# Patient Record
Sex: Female | Born: 1940 | Race: White | Hispanic: No | Smoking: Former smoker
Health system: Southern US, Community
[De-identification: ages and names within clinical notes are randomized; demographics above are authoritative.]

## PROBLEM LIST (undated history)

## (undated) DIAGNOSIS — K219 Gastro-esophageal reflux disease without esophagitis: Secondary | ICD-10-CM

## (undated) DIAGNOSIS — R51 Headache: Secondary | ICD-10-CM

## (undated) DIAGNOSIS — E039 Hypothyroidism, unspecified: Secondary | ICD-10-CM

## (undated) DIAGNOSIS — Z8489 Family history of other specified conditions: Secondary | ICD-10-CM

## (undated) DIAGNOSIS — D649 Anemia, unspecified: Secondary | ICD-10-CM

## (undated) DIAGNOSIS — Z87442 Personal history of urinary calculi: Secondary | ICD-10-CM

## (undated) DIAGNOSIS — C801 Malignant (primary) neoplasm, unspecified: Secondary | ICD-10-CM

## (undated) DIAGNOSIS — I10 Essential (primary) hypertension: Secondary | ICD-10-CM

## (undated) DIAGNOSIS — R519 Headache, unspecified: Secondary | ICD-10-CM

## (undated) DIAGNOSIS — E119 Type 2 diabetes mellitus without complications: Secondary | ICD-10-CM

## (undated) DIAGNOSIS — M1990 Unspecified osteoarthritis, unspecified site: Secondary | ICD-10-CM

## (undated) DIAGNOSIS — Z973 Presence of spectacles and contact lenses: Secondary | ICD-10-CM

## (undated) DIAGNOSIS — M7130 Other bursal cyst, unspecified site: Secondary | ICD-10-CM

## (undated) HISTORY — PX: COLONOSCOPY WITH ESOPHAGOGASTRODUODENOSCOPY (EGD): SHX5779

## (undated) HISTORY — PX: APPENDECTOMY: SHX54

## (undated) HISTORY — DX: Essential (primary) hypertension: I10

## (undated) HISTORY — PX: EYE SURGERY: SHX253

## (undated) HISTORY — PX: CHOLECYSTECTOMY: SHX55

## (undated) HISTORY — PX: BREAST SURGERY: SHX581

## (undated) HISTORY — DX: Type 2 diabetes mellitus without complications: E11.9

## (undated) HISTORY — PX: CYSTOSCOPY W/ STONE MANIPULATION: SHX1427

## (undated) HISTORY — DX: Malignant (primary) neoplasm, unspecified: C80.1

## (undated) HISTORY — PX: CATARACT EXTRACTION W/ INTRAOCULAR LENS  IMPLANT, BILATERAL: SHX1307

---

## 1982-07-11 HISTORY — PX: CHOLECYSTECTOMY: SHX55

## 1992-07-11 HISTORY — PX: BREAST SURGERY: SHX581

## 1999-02-16 ENCOUNTER — Ambulatory Visit (HOSPITAL_COMMUNITY): Admission: RE | Admit: 1999-02-16 | Discharge: 1999-02-16 | Payer: Self-pay | Admitting: *Deleted

## 2000-02-11 ENCOUNTER — Other Ambulatory Visit: Admission: RE | Admit: 2000-02-11 | Discharge: 2000-02-11 | Payer: Self-pay | Admitting: Obstetrics and Gynecology

## 2001-03-16 ENCOUNTER — Other Ambulatory Visit: Admission: RE | Admit: 2001-03-16 | Discharge: 2001-03-16 | Payer: Self-pay | Admitting: Obstetrics and Gynecology

## 2002-05-21 ENCOUNTER — Encounter: Payer: Self-pay | Admitting: Internal Medicine

## 2002-05-21 ENCOUNTER — Encounter: Admission: RE | Admit: 2002-05-21 | Discharge: 2002-05-21 | Payer: Self-pay | Admitting: Internal Medicine

## 2003-02-13 ENCOUNTER — Encounter: Admission: RE | Admit: 2003-02-13 | Discharge: 2003-02-13 | Payer: Self-pay | Admitting: Internal Medicine

## 2003-02-13 ENCOUNTER — Encounter: Payer: Self-pay | Admitting: Internal Medicine

## 2003-11-19 ENCOUNTER — Ambulatory Visit (HOSPITAL_COMMUNITY): Admission: RE | Admit: 2003-11-19 | Discharge: 2003-11-19 | Payer: Self-pay | Admitting: Internal Medicine

## 2004-11-08 ENCOUNTER — Encounter: Admission: RE | Admit: 2004-11-08 | Discharge: 2004-11-08 | Payer: Self-pay | Admitting: Internal Medicine

## 2004-12-10 ENCOUNTER — Ambulatory Visit (HOSPITAL_COMMUNITY): Admission: RE | Admit: 2004-12-10 | Discharge: 2004-12-10 | Payer: Self-pay | Admitting: *Deleted

## 2005-12-19 ENCOUNTER — Encounter: Admission: RE | Admit: 2005-12-19 | Discharge: 2005-12-19 | Payer: Self-pay | Admitting: Internal Medicine

## 2007-03-28 ENCOUNTER — Encounter: Admission: RE | Admit: 2007-03-28 | Discharge: 2007-03-28 | Payer: Self-pay | Admitting: Internal Medicine

## 2007-04-20 ENCOUNTER — Ambulatory Visit (HOSPITAL_BASED_OUTPATIENT_CLINIC_OR_DEPARTMENT_OTHER): Admission: RE | Admit: 2007-04-20 | Discharge: 2007-04-20 | Payer: Self-pay | Admitting: General Surgery

## 2007-04-20 ENCOUNTER — Encounter (INDEPENDENT_AMBULATORY_CARE_PROVIDER_SITE_OTHER): Payer: Self-pay | Admitting: General Surgery

## 2007-05-04 ENCOUNTER — Ambulatory Visit: Payer: Self-pay | Admitting: Internal Medicine

## 2007-05-10 ENCOUNTER — Ambulatory Visit (HOSPITAL_COMMUNITY): Admission: RE | Admit: 2007-05-10 | Discharge: 2007-05-10 | Payer: Self-pay | Admitting: Internal Medicine

## 2007-05-10 LAB — CBC WITH DIFFERENTIAL/PLATELET
Basophils Absolute: 0 10*3/uL (ref 0.0–0.1)
Eosinophils Absolute: 0.2 10*3/uL (ref 0.0–0.5)
HCT: 39 % (ref 34.8–46.6)
HGB: 13.2 g/dL (ref 11.6–15.9)
MCV: 83.2 fL (ref 81.0–101.0)
MONO#: 0.5 10*3/uL (ref 0.1–0.9)
Platelets: 245 10*3/uL (ref 145–400)
RDW: 13.6 % (ref 11.3–14.5)
lymph#: 1.7 10*3/uL (ref 0.9–3.3)

## 2007-05-10 LAB — COMPREHENSIVE METABOLIC PANEL
Albumin: 4.4 g/dL (ref 3.5–5.2)
Alkaline Phosphatase: 90 U/L (ref 39–117)
BUN: 25 mg/dL — ABNORMAL HIGH (ref 6–23)
CO2: 26 mEq/L (ref 19–32)
Glucose, Bld: 138 mg/dL — ABNORMAL HIGH (ref 70–99)

## 2007-05-10 LAB — LACTATE DEHYDROGENASE: LDH: 159 U/L (ref 94–250)

## 2007-11-06 ENCOUNTER — Ambulatory Visit: Payer: Self-pay | Admitting: Internal Medicine

## 2007-11-08 LAB — CBC WITH DIFFERENTIAL/PLATELET
EOS%: 1.8 % (ref 0.0–7.0)
HGB: 13.2 g/dL (ref 11.6–15.9)
LYMPH%: 29.2 % (ref 14.0–48.0)
MCH: 28 pg (ref 26.0–34.0)
MONO#: 0.7 10*3/uL (ref 0.1–0.9)
NEUT#: 4.6 10*3/uL (ref 1.5–6.5)
NEUT%: 59.8 % (ref 39.6–76.8)
Platelets: 261 10*3/uL (ref 145–400)
RBC: 4.7 10*6/uL (ref 3.70–5.32)
RDW: 13.5 % (ref 11.3–14.5)

## 2007-11-08 LAB — COMPREHENSIVE METABOLIC PANEL
ALT: 22 U/L (ref 0–35)
AST: 16 U/L (ref 0–37)
Alkaline Phosphatase: 75 U/L (ref 39–117)
Glucose, Bld: 108 mg/dL — ABNORMAL HIGH (ref 70–99)
Potassium: 4.3 mEq/L (ref 3.5–5.3)
Sodium: 141 mEq/L (ref 135–145)
Total Bilirubin: 0.5 mg/dL (ref 0.3–1.2)
Total Protein: 6.6 g/dL (ref 6.0–8.3)

## 2007-11-08 LAB — LACTATE DEHYDROGENASE: LDH: 150 U/L (ref 94–250)

## 2008-05-05 ENCOUNTER — Ambulatory Visit (HOSPITAL_COMMUNITY): Admission: RE | Admit: 2008-05-05 | Discharge: 2008-05-05 | Payer: Self-pay | Admitting: Internal Medicine

## 2008-05-06 ENCOUNTER — Ambulatory Visit: Payer: Self-pay | Admitting: Internal Medicine

## 2008-05-08 LAB — CBC WITH DIFFERENTIAL/PLATELET
EOS%: 4.1 % (ref 0.0–7.0)
HGB: 13.7 g/dL (ref 11.6–15.9)
MCH: 28.1 pg (ref 26.0–34.0)
MCHC: 33.6 g/dL (ref 32.0–36.0)
MCV: 83.7 fL (ref 81.0–101.0)
MONO%: 11.1 % (ref 0.0–13.0)
NEUT%: 57.4 % (ref 39.6–76.8)
Platelets: 203 10*3/uL (ref 145–400)
RBC: 4.87 10*6/uL (ref 3.70–5.32)
RDW: 13.7 % (ref 11.3–14.5)
WBC: 6 10*3/uL (ref 3.9–10.0)
lymph#: 1.6 10*3/uL (ref 0.9–3.3)

## 2008-05-08 LAB — COMPREHENSIVE METABOLIC PANEL
BUN: 23 mg/dL (ref 6–23)
Glucose, Bld: 155 mg/dL — ABNORMAL HIGH (ref 70–99)
Sodium: 140 mEq/L (ref 135–145)

## 2008-05-08 LAB — LACTATE DEHYDROGENASE: LDH: 153 U/L (ref 94–250)

## 2008-11-03 ENCOUNTER — Ambulatory Visit: Payer: Self-pay | Admitting: Internal Medicine

## 2008-11-05 LAB — CBC WITH DIFFERENTIAL/PLATELET
BASO%: 0.3 % (ref 0.0–2.0)
Basophils Absolute: 0 10*3/uL (ref 0.0–0.1)
Eosinophils Absolute: 0.2 10*3/uL (ref 0.0–0.5)
HGB: 13.6 g/dL (ref 11.6–15.9)
LYMPH%: 28.4 % (ref 14.0–49.7)
MCH: 28.1 pg (ref 25.1–34.0)
MCHC: 33.2 g/dL (ref 31.5–36.0)
MCV: 84.7 fL (ref 79.5–101.0)
NEUT#: 4.3 10*3/uL (ref 1.5–6.5)
RBC: 4.83 10*6/uL (ref 3.70–5.45)
WBC: 7.5 10*3/uL (ref 3.9–10.3)
lymph#: 2.1 10*3/uL (ref 0.9–3.3)

## 2008-11-05 LAB — COMPREHENSIVE METABOLIC PANEL
ALT: 37 U/L — ABNORMAL HIGH (ref 0–35)
Albumin: 4.2 g/dL (ref 3.5–5.2)
Alkaline Phosphatase: 87 U/L (ref 39–117)
CO2: 28 mEq/L (ref 19–32)
Glucose, Bld: 105 mg/dL — ABNORMAL HIGH (ref 70–99)
Total Bilirubin: 0.6 mg/dL (ref 0.3–1.2)
Total Protein: 6.5 g/dL (ref 6.0–8.3)

## 2008-11-05 LAB — LACTATE DEHYDROGENASE: LDH: 155 U/L (ref 94–250)

## 2009-04-30 ENCOUNTER — Ambulatory Visit: Payer: Self-pay | Admitting: Internal Medicine

## 2009-05-04 ENCOUNTER — Ambulatory Visit (HOSPITAL_COMMUNITY): Admission: RE | Admit: 2009-05-04 | Discharge: 2009-05-04 | Payer: Self-pay | Admitting: Internal Medicine

## 2009-05-04 LAB — CBC WITH DIFFERENTIAL/PLATELET
EOS%: 2.6 % (ref 0.0–7.0)
HCT: 41.8 % (ref 34.8–46.6)
HGB: 13.9 g/dL (ref 11.6–15.9)
LYMPH%: 25.2 % (ref 14.0–49.7)
MCHC: 33.2 g/dL (ref 31.5–36.0)
MCV: 85 fL (ref 79.5–101.0)
MONO#: 0.8 10*3/uL (ref 0.1–0.9)
MONO%: 9.3 % (ref 0.0–14.0)
NEUT#: 5.2 10*3/uL (ref 1.5–6.5)
RDW: 13.7 % (ref 11.2–14.5)

## 2009-05-04 LAB — COMPREHENSIVE METABOLIC PANEL
AST: 17 U/L (ref 0–37)
Albumin: 4.6 g/dL (ref 3.5–5.2)
Alkaline Phosphatase: 93 U/L (ref 39–117)
BUN: 24 mg/dL — ABNORMAL HIGH (ref 6–23)
CO2: 25 mEq/L (ref 19–32)
Chloride: 107 mEq/L (ref 96–112)
Potassium: 4.3 mEq/L (ref 3.5–5.3)
Total Bilirubin: 0.5 mg/dL (ref 0.3–1.2)

## 2009-05-04 LAB — LACTATE DEHYDROGENASE: LDH: 169 U/L (ref 94–250)

## 2009-11-03 IMAGING — CR DG CHEST 2V
2 series · 2 of 2 positions shown · non-contrast
Comparison: 05/10/2007

CLINICAL DATA: Melanoma

CHEST - 2 VIEW

[w chest pa]
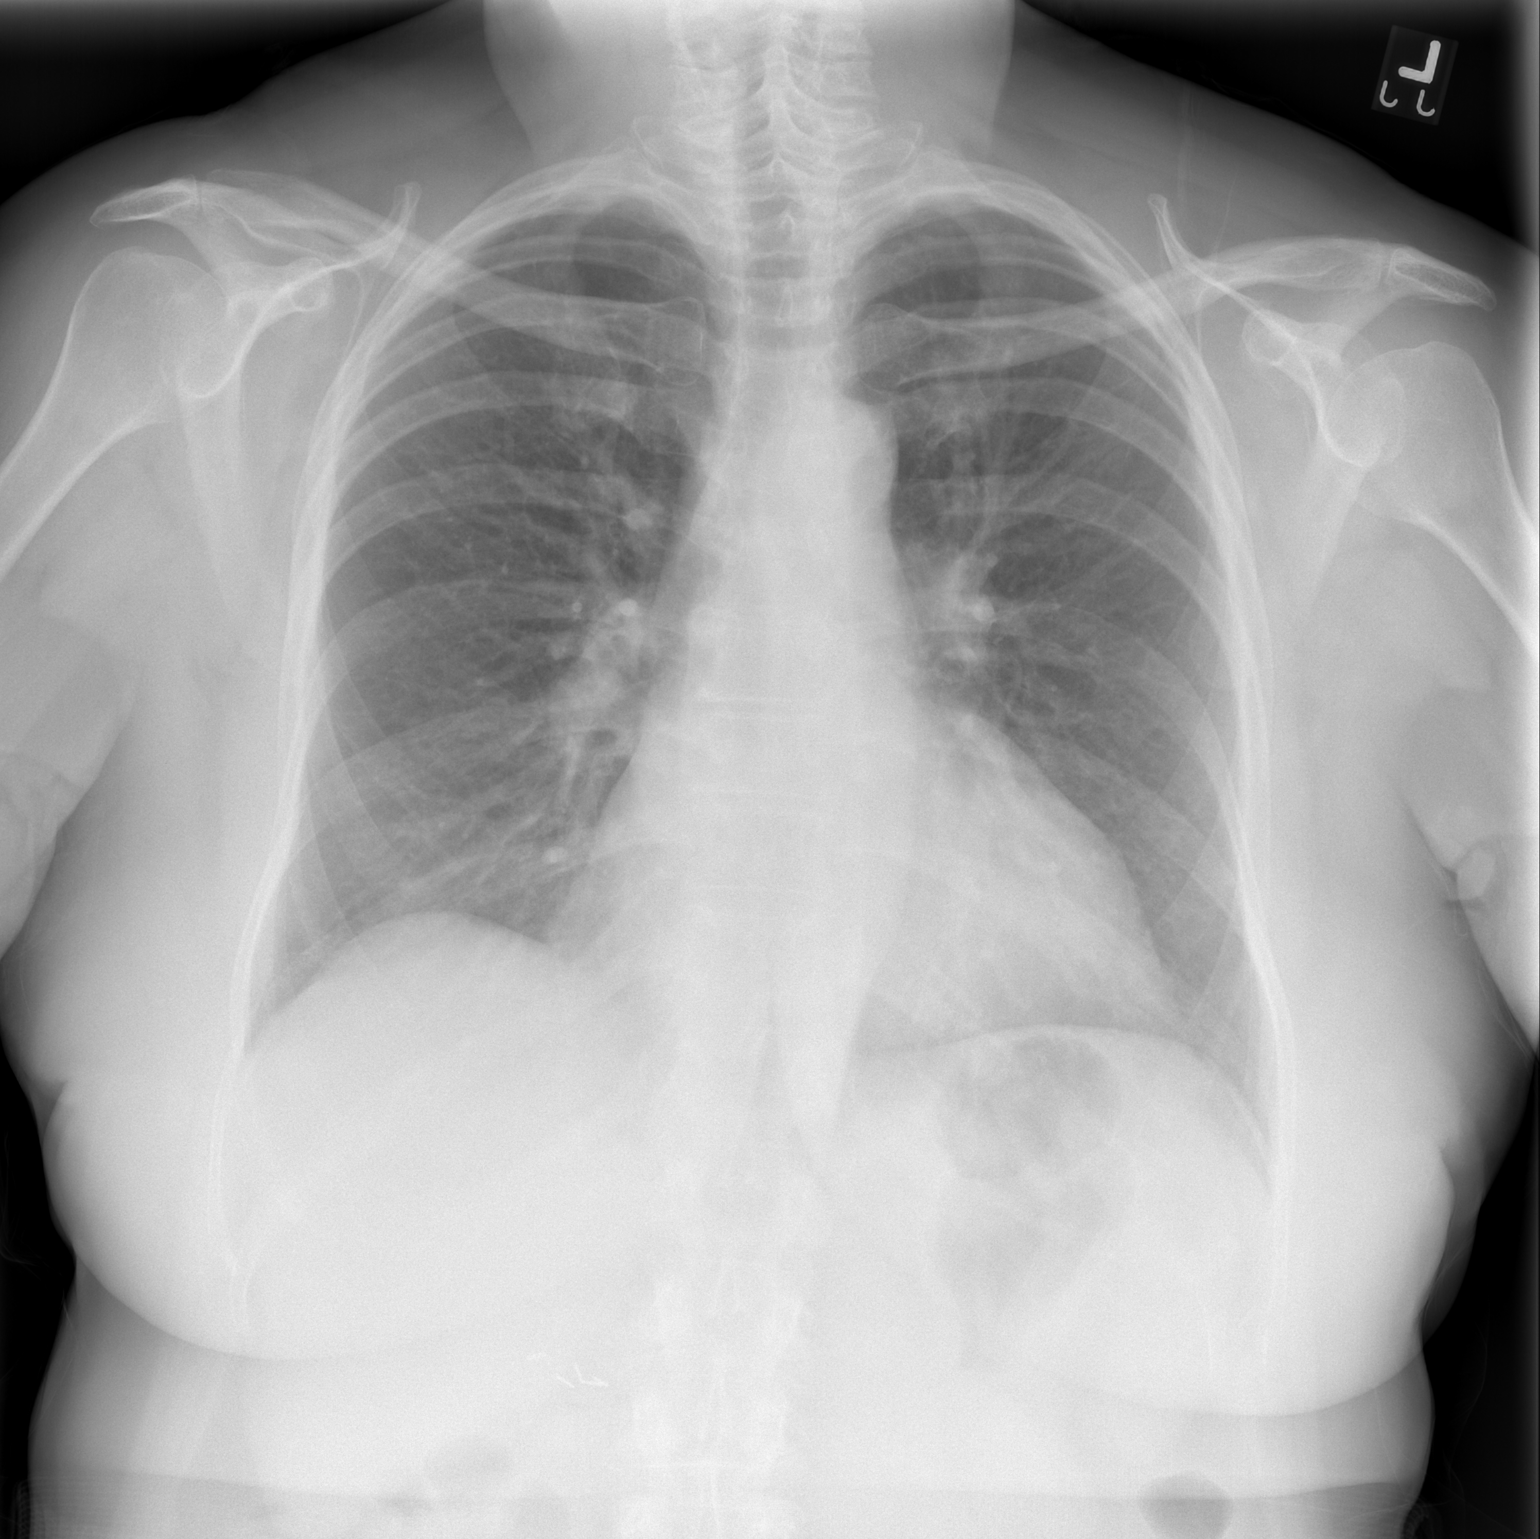

[w chest lat]
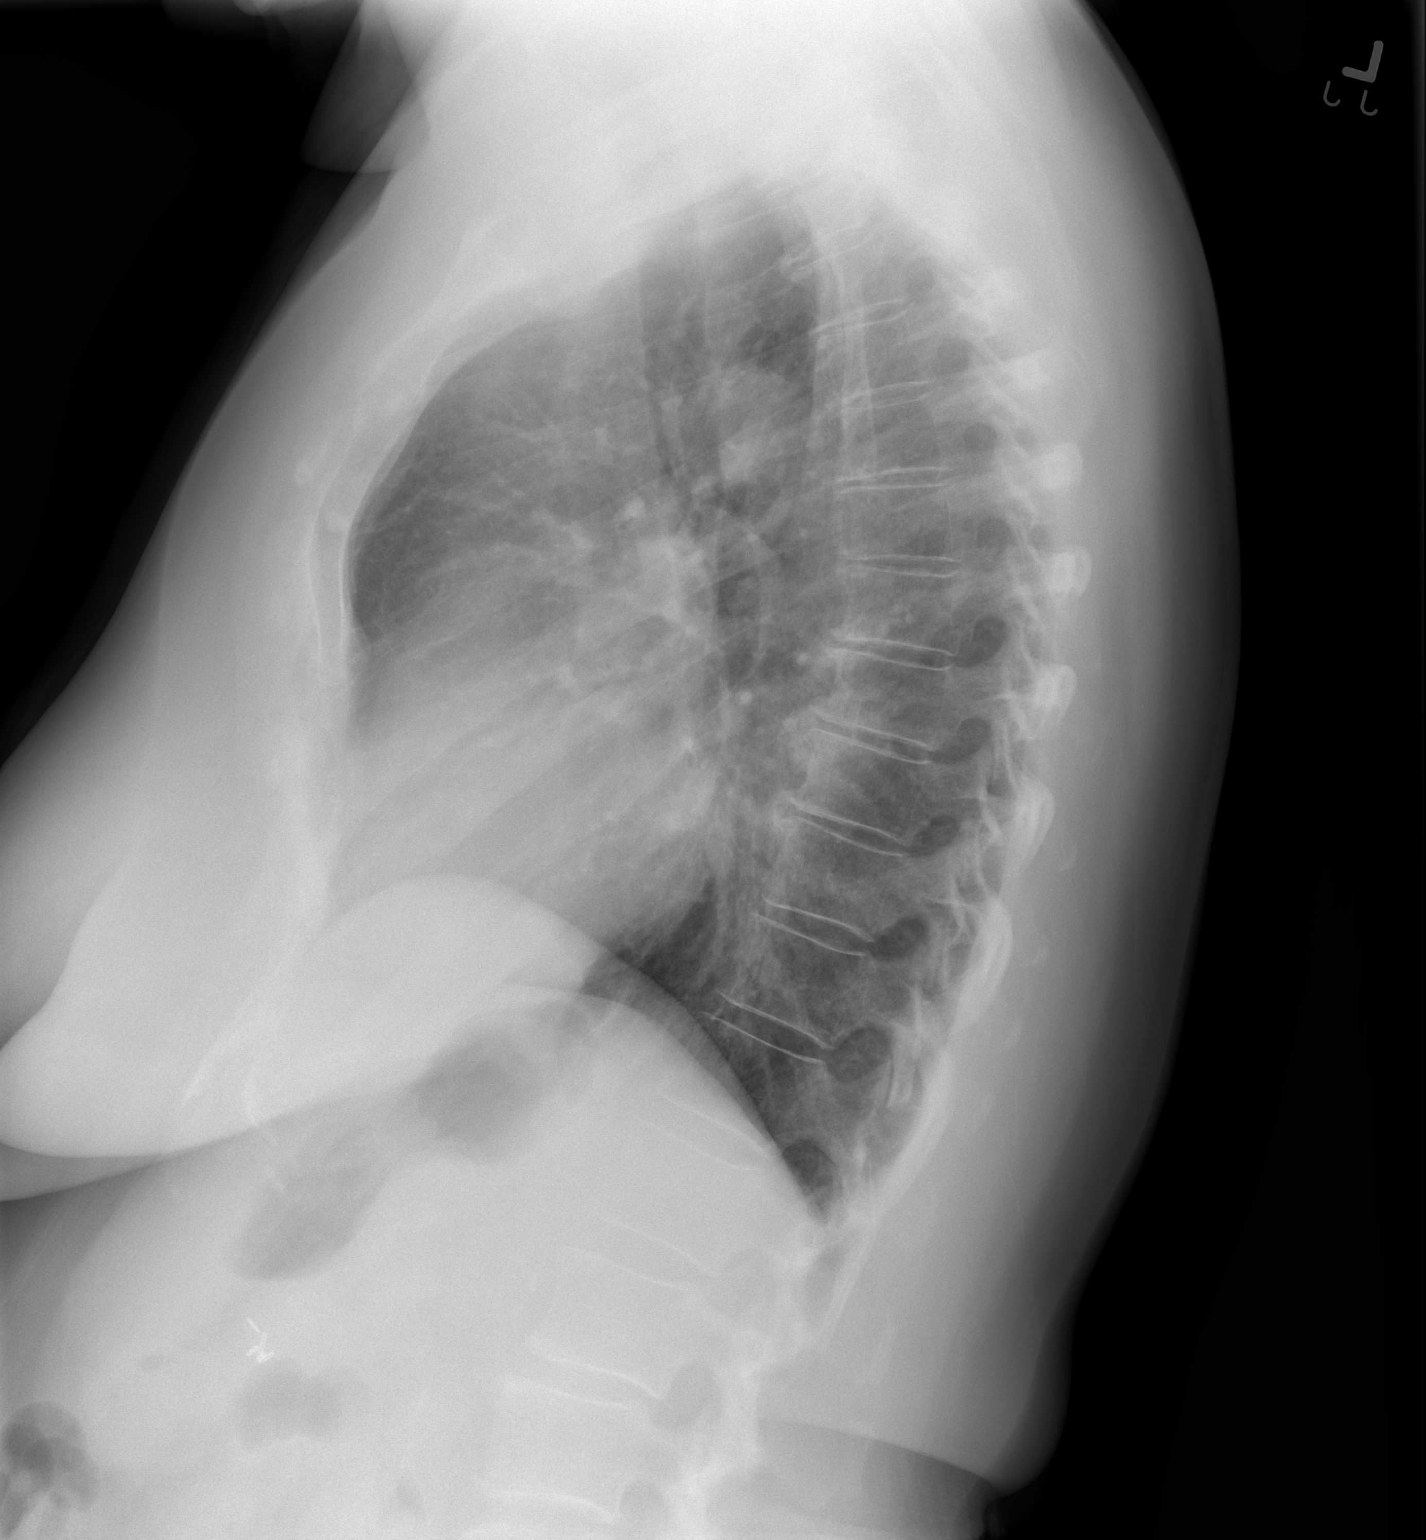

[2 of 2 positions shown; findings below may reference images not displayed]

FINDINGS: Heart mildly enlarged without change.  No congestive
heart failure, metastatic or active disease.  No osseous lesions
IMPRESSION: Stable cardiomegaly - no active disease.

## 2010-04-27 ENCOUNTER — Ambulatory Visit: Payer: Self-pay | Admitting: Internal Medicine

## 2010-04-29 ENCOUNTER — Ambulatory Visit (HOSPITAL_COMMUNITY): Admission: RE | Admit: 2010-04-29 | Discharge: 2010-04-29 | Payer: Self-pay | Admitting: Internal Medicine

## 2010-04-29 LAB — COMPREHENSIVE METABOLIC PANEL
ALT: 19 U/L (ref 0–35)
Alkaline Phosphatase: 92 U/L (ref 39–117)
CO2: 26 mEq/L (ref 19–32)
Creatinine, Ser: 0.97 mg/dL (ref 0.40–1.20)
Potassium: 4.4 mEq/L (ref 3.5–5.3)
Sodium: 140 mEq/L (ref 135–145)
Total Bilirubin: 0.4 mg/dL (ref 0.3–1.2)

## 2010-04-29 LAB — CBC WITH DIFFERENTIAL/PLATELET
Basophils Absolute: 0 10*3/uL (ref 0.0–0.1)
Eosinophils Absolute: 0.1 10*3/uL (ref 0.0–0.5)
HGB: 13.7 g/dL (ref 11.6–15.9)
LYMPH%: 23.8 % (ref 14.0–49.7)
MCV: 84.2 fL (ref 79.5–101.0)
MONO#: 0.7 10*3/uL (ref 0.1–0.9)
NEUT#: 4.2 10*3/uL (ref 1.5–6.5)
RBC: 4.78 10*6/uL (ref 3.70–5.45)
WBC: 6.7 10*3/uL (ref 3.9–10.3)

## 2010-11-23 NOTE — Op Note (Signed)
NAME:  Linda Frederick, Linda Frederick                  ACCOUNT NO.:  [id number]   MEDICAL RECORD NO.:  [id number]          PATIENT TYPE:  AMB   LOCATION:  DSC                          FACILITY:  [facility]   PHYSICIAN:  Gabrielle Dare. Janee Morn, M.D.DATE OF BIRTH:  09-10-1940   DATE OF PROCEDURE:  04/20/2007  DATE OF DISCHARGE:                               OPERATIVE REPORT   PREOPERATIVE DIAGNOSIS:  Melanoma, left arm.   POSTOPERATIVE DIAGNOSIS:  Melanoma, left arm.   PROCEDURE:  1. Left axillary sentinel lymph node biopsy with lymph node mapping      and blue dye injection.  2. Wide excision, melanoma, left arm, 10 x 4.5 cm.   SURGEON:  Violeta Gelinas, M.D.   HISTORY OF PRESENT ILLNESS:  Linda Frederick is a 70 year old female who I  evaluated in the office for a melanoma in her left arm.  She had a  biopsy done by Dr. Yetta Barre showing a superficial spreading melanoma about  1.75 mm in thickness.  Margins, however, were positive.  There was no  ulceration.  She presents today for left axillary sentinel lymph node  biopsy and wide excision of this melanoma.   PROCEDURE IN DETAIL:  Informed consent was obtained.  The patient was  identified in the preoperative holding area.  She had been injected  around her biopsy site by nuclear medicine.  She noted some recent  poison ivy rash, but none of this was near the planned operative sites.  She received intravenous antibiotics.  She was brought to the operating  room.   PROCEDURE IN DETAIL:  General anesthesia was administered by the  anesthesia staff by laryngeal mask airway.  Her biopsy site was then  injected with 1 mL of methylene blue in a 1:4 concentration with  injectable saline.  This was a cutaneous injection that was then  massaged for 4 minutes by the clock.  Next, her left upper extremity  anterior chest and left axilla were prepped and draped in a sterile  fashion.  The NeoProbe was used to find a hot location in her axilla.  Quarter percent Marcaine with  epinephrine was injected at this site.  A  transverse incision was made.  Subcutaneous tissues were dissected down  into the axillary fat.  We avoided the thoracodorsal and long thoracic  nerves as well as the axillary vein.  We found a blue lymph node that  was hot with the NeoProbe.  This was circumferentially dissected.  Bovie  was used to get good hemostasis, and this was removed and sent as  sentinel lymph node.  It was a hot blue node.  No further blue nodes  were identified in the axilla, and there was no other high signal  readings on the NeoProbe.  So, this completed the sentinel lymph node  dissection.  The wound was copiously irrigated, and meticulous  hemostasis was assured.  Some additional local anesthetic was injected.  The wound was then closed in layers with deep tissues approximated with  running 3-0 Vicryl suture and the skin closed with a running 4-0  Monocryl subcuticular  stitch.  Sponge, needle, and instrument counts  were correct for that portion of the procedure.  The patient's arm was  then brought over in front of her within the sterile field, and  attention was directed to her triceps area.  This is where her biopsy  site was.  We measured out 1.5-cm margin circumferentially.  An  elliptical incision was then planned to encompass this.  This was 10 x  4.5 cm in size.  Some additional local anesthetic was injected in this  area.  An elliptical incision was made.  Subcutaneous tissues were  dissected down to the underlying fascia, excising this ellipse in one  piece.  Hemostasis was obtained with a Bovie cautery.  The specimen was  marked for orientation for pathology and sent away.  We discarded those  instruments that were used handling it.  The wound was copiously  irrigated.  Meticulous hemostasis was ensured.  Some flaps were raised  subcutaneously on the medial and lateral margins.  Wound was then closed  with deep layers approximated with interrupted 2-0  Vicryl sutures.  Area  was again irrigated.  Meticulous hemostasis was assured, and the skin  was closed with interrupted 3-0 nylon sutures.  Sponge, needle and  instrument counts were correct.  Sterile dressing was applied.  The  patient tolerated the procedure well without apparent complication.  He  was taken to the recovery room in stable condition.      Gabrielle Dare Janee Morn, M.D.  Electronically Signed     BET/MEDQ  D:  04/20/2007  T:  04/21/2007  Job:  [id number]

## 2011-04-21 LAB — BASIC METABOLIC PANEL
Calcium: 9.5
Chloride: 104
Creatinine, Ser: 1.32 — ABNORMAL HIGH
GFR calc Af Amer: 49 — ABNORMAL LOW
GFR calc non Af Amer: 41 — ABNORMAL LOW
Glucose, Bld: 135 — ABNORMAL HIGH
Potassium: 4.4

## 2011-05-02 ENCOUNTER — Ambulatory Visit (HOSPITAL_COMMUNITY)
Admission: RE | Admit: 2011-05-02 | Discharge: 2011-05-02 | Disposition: A | Discharge status: Home / Self Care | Payer: Medicare Other | Source: Ambulatory Visit | Attending: Internal Medicine | Admitting: Internal Medicine

## 2011-05-02 ENCOUNTER — Other Ambulatory Visit: Payer: Self-pay | Admitting: Internal Medicine

## 2011-05-02 DIAGNOSIS — Z9089 Acquired absence of other organs: Secondary | ICD-10-CM | POA: Insufficient documentation

## 2011-05-02 DIAGNOSIS — C439 Malignant melanoma of skin, unspecified: Secondary | ICD-10-CM

## 2011-05-02 DIAGNOSIS — Z85820 Personal history of malignant melanoma of skin: Secondary | ICD-10-CM | POA: Insufficient documentation

## 2011-05-09 ENCOUNTER — Telehealth: Payer: Self-pay | Admitting: Internal Medicine

## 2011-05-09 ENCOUNTER — Other Ambulatory Visit: Payer: Self-pay | Admitting: Internal Medicine

## 2011-05-09 ENCOUNTER — Encounter (HOSPITAL_BASED_OUTPATIENT_CLINIC_OR_DEPARTMENT_OTHER): Payer: Medicare Other | Admitting: Internal Medicine

## 2011-05-09 DIAGNOSIS — C4360 Malignant melanoma of unspecified upper limb, including shoulder: Secondary | ICD-10-CM

## 2011-05-09 LAB — CBC WITH DIFFERENTIAL/PLATELET
BASO%: 0.2 % (ref 0.0–2.0)
Eosinophils Absolute: 0.1 10*3/uL (ref 0.0–0.5)
HCT: 42.7 % (ref 34.8–46.6)
MCH: 28 pg (ref 25.1–34.0)
MCV: 84.8 fL (ref 79.5–101.0)
MONO#: 0.6 10*3/uL (ref 0.1–0.9)
MONO%: 8.9 % (ref 0.0–14.0)
NEUT%: 64.1 % (ref 38.4–76.8)
RBC: 5.03 10*6/uL (ref 3.70–5.45)
RDW: 13.7 % (ref 11.2–14.5)
WBC: 7 10*3/uL (ref 3.9–10.3)

## 2011-05-09 LAB — COMPREHENSIVE METABOLIC PANEL
ALT: 16 U/L (ref 0–35)
Albumin: 4.3 g/dL (ref 3.5–5.2)
CO2: 28 mEq/L (ref 19–32)
Calcium: 10.5 mg/dL (ref 8.4–10.5)
Chloride: 103 mEq/L (ref 96–112)
Creatinine, Ser: 0.94 mg/dL (ref 0.50–1.10)

## 2011-05-09 LAB — LACTATE DEHYDROGENASE: LDH: 149 U/L (ref 94–250)

## 2011-05-09 NOTE — Telephone Encounter (Signed)
gv pt appt schedule for oct 2013 including cxr to be done @ [facility] a few days b4 f/u.

## 2012-05-07 ENCOUNTER — Other Ambulatory Visit: Payer: Self-pay | Admitting: Internal Medicine

## 2012-05-07 ENCOUNTER — Ambulatory Visit (HOSPITAL_COMMUNITY)
Admission: RE | Admit: 2012-05-07 | Discharge: 2012-05-07 | Disposition: A | Discharge status: Home / Self Care | Payer: Medicare Other | Source: Ambulatory Visit | Attending: Internal Medicine | Admitting: Internal Medicine

## 2012-05-07 DIAGNOSIS — Z85820 Personal history of malignant melanoma of skin: Secondary | ICD-10-CM | POA: Insufficient documentation

## 2012-05-07 DIAGNOSIS — I10 Essential (primary) hypertension: Secondary | ICD-10-CM | POA: Insufficient documentation

## 2012-05-08 ENCOUNTER — Other Ambulatory Visit (HOSPITAL_BASED_OUTPATIENT_CLINIC_OR_DEPARTMENT_OTHER): Payer: Medicare Other | Admitting: Lab

## 2012-05-08 ENCOUNTER — Ambulatory Visit (HOSPITAL_BASED_OUTPATIENT_CLINIC_OR_DEPARTMENT_OTHER): Payer: Medicare Other | Admitting: Internal Medicine

## 2012-05-08 VITALS — BP 131/72 | HR 52 | Temp 97.5°F | Resp 20 | Wt 183.0 lb

## 2012-05-08 DIAGNOSIS — C439 Malignant melanoma of skin, unspecified: Secondary | ICD-10-CM

## 2012-05-08 DIAGNOSIS — Z85820 Personal history of malignant melanoma of skin: Secondary | ICD-10-CM

## 2012-05-08 LAB — COMPREHENSIVE METABOLIC PANEL (CC13)
ALT: 18 U/L (ref 0–55)
AST: 13 U/L (ref 5–34)
Albumin: 4.2 g/dL (ref 3.5–5.0)
CO2: 30 mEq/L — ABNORMAL HIGH (ref 22–29)
Calcium: 10.1 mg/dL (ref 8.4–10.4)
Chloride: 106 mEq/L (ref 98–107)
Potassium: 4.1 mEq/L (ref 3.5–5.1)
Sodium: 142 mEq/L (ref 136–145)
Total Protein: 6.8 g/dL (ref 6.4–8.3)

## 2012-05-08 LAB — CBC WITH DIFFERENTIAL/PLATELET
BASO%: 0.2 % (ref 0.0–2.0)
HCT: 41.3 % (ref 34.8–46.6)
MCHC: 33.6 g/dL (ref 31.5–36.0)
MONO#: 0.7 10*3/uL (ref 0.1–0.9)
NEUT%: 66.1 % (ref 38.4–76.8)
RBC: 4.89 10*6/uL (ref 3.70–5.45)
RDW: 13.9 % (ref 11.2–14.5)
WBC: 8 10*3/uL (ref 3.9–10.3)
lymph#: 1.9 10*3/uL (ref 0.9–3.3)

## 2012-05-08 LAB — LACTATE DEHYDROGENASE (CC13): LDH: 160 U/L (ref 125–220)

## 2012-05-08 NOTE — Progress Notes (Signed)
[facility] Telephone:([phone]) [phone]   Fax:([phone]) [phone]  OFFICE PROGRESS NOTE  DIAGNOSIS: Stage IB (T2 A., N0, MX) malignant melanoma diagnosed in October of 2008.  PRIOR THERAPY: Status post wide excision followed by left axillary sentinel lymph node biopsy that was negative for malignancy under the care of Dr. Janee Morn on 04/20/2007.  CURRENT THERAPY: Observation.  INTERVAL HISTORY: Linda Frederick 71 y.o. female returns to the clinic today for routine annual followup visit. The patient is feeling fine today with no specific complaints. She denied having any significant weight loss or night sweats. She denied having any palpable lymphadenopathy. She has no chest pain, shortness breath, cough or hemoptysis. She was seen by her dermatologist Dr. Arminda Resides recently and no suspicious skin lesion was found. She has repeat CBC and chest x-ray performed recently and she is here for evaluation and discussion of her lab and imaging results.  ALLERGIES:  is allergic to bee venom; codeine; and macrodantin.  MEDICATIONS:  Current Outpatient Prescriptions  Medication Sig Dispense Refill  . amLODipine (NORVASC) 10 MG tablet Take 10 mg by mouth daily.      Marland Kitchen atenolol (TENORMIN) 25 MG tablet Take 25 mg by mouth daily.      Marland Kitchen atorvastatin (LIPITOR) 20 MG tablet Take 20 mg by mouth daily.      . metFORMIN (GLUCOPHAGE) 500 MG tablet Take 500 mg by mouth 2 (two) times daily with a meal.      . UNKNOWN TO PATIENT Ointment given by dermatologist to put on fingers for an infection in the nail beds        REVIEW OF SYSTEMS:  A comprehensive review of systems was negative.   PHYSICAL EXAMINATION: General appearance: alert, cooperative and no distress Head: Normocephalic, without obvious abnormality, atraumatic Neck: no adenopathy Lymph nodes: Cervical, supraclavicular, and axillary nodes normal. Resp: clear to auscultation bilaterally Cardio: regular rate and rhythm, S1, S2 normal, no  murmur, click, rub or gallop GI: soft, non-tender; bowel sounds normal; no masses,  no organomegaly Extremities: extremities normal, atraumatic, no cyanosis or edema Skin exam: Showed no suspicious lesions.  ECOG PERFORMANCE STATUS: 0 - Asymptomatic  Blood pressure 131/72, pulse 52, temperature 97.5 F (36.4 C), temperature source Oral, resp. rate 20, weight 183 lb (83.008 kg).  LABORATORY DATA: Lab Results  Component Value Date   WBC 8.0 05/08/2012   HGB 13.9 05/08/2012   HCT 41.3 05/08/2012   MCV 84.4 05/08/2012   PLT 231 05/08/2012      Chemistry      Component Value Date/Time   NA 142 05/08/2012 1007   NA 141 05/09/2011 1312   K 4.1 05/08/2012 1007   K 4.5 05/09/2011 1312   CL 106 05/08/2012 1007   CL 103 05/09/2011 1312   CO2 30* 05/08/2012 1007   CO2 28 05/09/2011 1312   BUN 22.0 05/08/2012 1007   BUN 21 05/09/2011 1312   CREATININE 1.0 05/08/2012 1007   CREATININE 0.94 05/09/2011 1312      Component Value Date/Time   CALCIUM 10.1 05/08/2012 1007   CALCIUM 10.5 05/09/2011 1312   ALKPHOS 102 05/08/2012 1007   ALKPHOS 85 05/09/2011 1312   AST 13 05/08/2012 1007   AST 17 05/09/2011 1312   ALT 18 05/08/2012 1007   ALT 16 05/09/2011 1312   BILITOT 0.74 05/08/2012 1007   BILITOT 0.6 05/09/2011 1312       RADIOGRAPHIC STUDIES: Dg Chest 2 View  05/07/2012  *RADIOLOGY  REPORT*  Clinical Data: History of melanoma.  Hypertension  CHEST - 2 VIEW  Comparison: 05/02/2011  Findings: The cardiac silhouette is normal in size and configuration.  The mediastinum is normal in contour caliber.  The lungs are clear.  No pulmonary nodules.  The bony thorax is demineralized, but intact.  No osteoblastic or osteolytic lesions.  No change from the prior study.  IMPRESSION: No active disease of the chest.  No evidence of metastatic disease.   Original Report Authenticated By: Domenic Moras, M.D.     ASSESSMENT: This is a very pleasant 71 years old white female with history of  stage IB malignant melanoma status post wide excision with sentinel lymph node biopsy and has been observation since October of 2008 with no evidence for disease recurrence.  PLAN: I discussed the lab and chest x-ray results with the patient today. It has been more than 5 years since her initial diagnosis. I recommended for the patient to continue routine followup visit and observation by her primary care physician at this point. I don't see a need to continue to see Linda Frederick at regular basis at the cancer Center but be happy to see her in the future if needed. The patient agreed to the current plan. All questions were answered. The patient knows to call the clinic with any problems, questions or concerns. We can certainly see the patient much sooner if necessary.

## 2012-05-08 NOTE — Patient Instructions (Signed)
Your lab and chest x-ray are okay today. Continue routine followup visit with your primary care physician. I would see your at the cancer Center on an as-needed basis.

## 2013-03-20 ENCOUNTER — Ambulatory Visit (INDEPENDENT_AMBULATORY_CARE_PROVIDER_SITE_OTHER): Payer: 59 | Admitting: Family Medicine

## 2013-03-20 VITALS — BP 148/92 | HR 71 | Temp 98.9°F | Resp 18 | Ht 66.0 in | Wt 183.2 lb

## 2013-03-20 DIAGNOSIS — E119 Type 2 diabetes mellitus without complications: Secondary | ICD-10-CM | POA: Insufficient documentation

## 2013-03-20 DIAGNOSIS — S0093XA Contusion of unspecified part of head, initial encounter: Secondary | ICD-10-CM

## 2013-03-20 DIAGNOSIS — I10 Essential (primary) hypertension: Secondary | ICD-10-CM | POA: Insufficient documentation

## 2013-03-20 DIAGNOSIS — S0003XA Contusion of scalp, initial encounter: Secondary | ICD-10-CM

## 2013-03-20 DIAGNOSIS — E785 Hyperlipidemia, unspecified: Secondary | ICD-10-CM | POA: Insufficient documentation

## 2013-03-20 NOTE — Progress Notes (Signed)
Is a 72 year old woman, patient of Dr. Burton Apley, who is retired. She is washing the top of her [employer] in the driveway when her bladder slipped and she fell and struck the back of her head. This occurred about one hour prior to arrival. She's a little sore on the occiput but otherwise has no headache. She has no diplopia, nausea, vomiting, or history of loss of consciousness. She's moving all extremities equally and is able to walk without any instability.  Patient is not taking any anticoagulants other than baby aspirin daily. She has hypertension, type 2 diabetes which has been controlled, and hyperlipidemia.  Objective: Alert, no distress, good eye contact and appropriate responses. HEENT: Unremarkable with flat fundi. No battle sign. Pupils equal reactive to light. TMs normal. Oropharynx clear. Face shows no abrasions or contusions Examination scalp reveals some mild swelling over the inion without any palpable cranial defect. Neck: Nontender, full range of motion without any skin abnormalities Extremities: No contusions, abrasions, or difficulty moving x4 Neurologically: Cranial nerves 3 through 13 intact, status: Sharp, appropriate, motor: Symmetric, gait: Stable; reflexes: Normal biceps jerk, triceps jerk, knee jerk, ankle jerk.  Assessment: No evidence for intracranial hemorrhage  Plan: Call if headache worsens or if she develops nausea, vomiting, diplopia or difficulty moving any extremity. Otherwise apply ice to the contused scalp and take Tylenol when necessary  Signed, Elvina Sidle

## 2013-06-04 ENCOUNTER — Other Ambulatory Visit: Payer: Self-pay | Admitting: Internal Medicine

## 2013-06-04 ENCOUNTER — Ambulatory Visit
Admission: RE | Admit: 2013-06-04 | Discharge: 2013-06-04 | Disposition: A | Discharge status: Home / Self Care | Payer: Medicare Other | Source: Ambulatory Visit | Attending: Internal Medicine | Admitting: Internal Medicine

## 2013-06-04 DIAGNOSIS — R059 Cough, unspecified: Secondary | ICD-10-CM

## 2013-06-04 DIAGNOSIS — R05 Cough: Secondary | ICD-10-CM

## 2014-10-06 ENCOUNTER — Ambulatory Visit
Admission: RE | Admit: 2014-10-06 | Discharge: 2014-10-06 | Disposition: A | Discharge status: Home / Self Care | Payer: Medicare Other | Source: Ambulatory Visit | Attending: Internal Medicine | Admitting: Internal Medicine

## 2014-10-06 ENCOUNTER — Other Ambulatory Visit: Payer: Self-pay | Admitting: Internal Medicine

## 2014-10-06 DIAGNOSIS — M1712 Unilateral primary osteoarthritis, left knee: Secondary | ICD-10-CM

## 2015-02-17 ENCOUNTER — Other Ambulatory Visit: Payer: Self-pay | Admitting: Internal Medicine

## 2015-02-17 ENCOUNTER — Ambulatory Visit
Admission: RE | Admit: 2015-02-17 | Discharge: 2015-02-17 | Disposition: A | Discharge status: Home / Self Care | Payer: Medicare Other | Source: Ambulatory Visit | Attending: Internal Medicine | Admitting: Internal Medicine

## 2015-02-17 DIAGNOSIS — R609 Edema, unspecified: Secondary | ICD-10-CM

## 2015-07-27 ENCOUNTER — Ambulatory Visit (INDEPENDENT_AMBULATORY_CARE_PROVIDER_SITE_OTHER): Payer: Medicare Other | Admitting: Emergency Medicine

## 2015-07-27 VITALS — BP 124/78 | HR 60 | Temp 98.4°F | Resp 17 | Ht 64.0 in | Wt 178.0 lb

## 2015-07-27 DIAGNOSIS — J209 Acute bronchitis, unspecified: Secondary | ICD-10-CM

## 2015-07-27 MED ORDER — IPRATROPIUM BROMIDE 0.02 % IN SOLN
0.5000 mg | Freq: Once | RESPIRATORY_TRACT | Status: AC
  Administered 2015-07-27: 0.5 mg via RESPIRATORY_TRACT

## 2015-07-27 MED ORDER — HYDROCOD POLST-CPM POLST ER 10-8 MG/5ML PO SUER: 5.0000 mL | Freq: Two times a day (BID) | ORAL | Status: DC

## 2015-07-27 MED ORDER — ALBUTEROL SULFATE HFA 108 (90 BASE) MCG/ACT IN AERS: 2.0000 | INHALATION_SPRAY | RESPIRATORY_TRACT | Status: DC | PRN

## 2015-07-27 MED ORDER — AZITHROMYCIN 250 MG PO TABS: ORAL_TABLET | ORAL | Status: DC

## 2015-07-27 MED ORDER — ALBUTEROL SULFATE (2.5 MG/3ML) 0.083% IN NEBU
5.0000 mg | INHALATION_SOLUTION | Freq: Once | RESPIRATORY_TRACT | Status: AC
  Administered 2015-07-27: 5 mg via RESPIRATORY_TRACT

## 2015-07-27 NOTE — Patient Instructions (Signed)
Metered Dose Inhaler (No Spacer Used) Inhaled medicines are the basis of treatment for asthma and other breathing problems. Inhaled medicine can only be effective if used properly. Good technique assures that the medicine reaches the lungs. Metered dose inhalers (MDIs) are used to deliver a variety of inhaled medicines. These include quick relief or rescue medicines (such as bronchodilators) and controller medicines (such as corticosteroids). The medicine is delivered by pushing down on a metal canister to release a set amount of spray. If you are using different kinds of inhalers, use your quick relief medicine to open the airways 10-15 minutes before using a steroid, if instructed to do so by your health care provider. If you are unsure which inhalers to use and the order of using them, ask your health care provider, nurse, or respiratory therapist. HOW TO USE THE INHALER 1. Remove the cap from the inhaler. 2. If you are using the inhaler for the first time, you will need to prime it. Shake the inhaler for 5 seconds and release four puffs into the air, away from your face. Ask your health care provider or pharmacist if you have questions about priming your inhaler. 3. Shake the inhaler for 5 seconds before each breath in (inhalation). 4. Position the inhaler so that the top of the canister faces up. 5. Put your index finger on the top of the medicine canister. Your thumb supports the bottom of the inhaler. 6. Open your mouth. 7. Either place the inhaler between your teeth and place your lips tightly around the mouthpiece, or hold the inhaler 1-2 inches away from your open mouth. If you are unsure of which technique to use, ask your health care provider. 8. Breathe out (exhale) normally and as completely as possible. 9. Press the canister down with the index finger to release the medicine. 10. At the same time as the canister is pressed, inhale deeply and slowly until your lungs are completely filled.  This should take 4-6 seconds. Keep your tongue down. 11. Hold the medicine in your lungs for 5-10 seconds (10 seconds is best). This helps the medicine get into the small airways of your lungs. 12. Breathe out slowly, through pursed lips. Whistling is an example of pursed lips. 13. Wait at least 1 minute between puffs. Continue with the above steps until you have taken the number of puffs your health care provider has ordered. Do not use the inhaler more than your health care provider directs you to. 14. Replace the cap on the inhaler. 15. Follow the directions from your health care provider or the inhaler insert for cleaning the inhaler. If you are using a steroid inhaler, after your last puff, rinse your mouth with water, gargle, and spit out the water. Do not swallow the water. AVOID:  Inhaling before or after starting the spray of medicine. It takes practice to coordinate your breathing with triggering the spray.  Inhaling through the nose (rather than the mouth) when triggering the spray. HOW TO DETERMINE IF YOUR INHALER IS FULL OR NEARLY EMPTY You cannot know when an inhaler is empty by shaking it. Some inhalers are now being made with dose counters. Ask your health care provider for a prescription that has a dose counter if you feel you need that extra help. If your inhaler does not have a counter, ask your health care provider to help you determine the date you need to refill your inhaler. Write the refill date on a calendar or your inhaler canister. Refill   your inhaler 7-10 days before it runs out. Be sure to keep an adequate supply of medicine. This includes making sure it has not expired, and making sure you have a spare inhaler. SEEK MEDICAL CARE IF:  Symptoms are only partially relieved with your inhaler.  You are having trouble using your inhaler.  You experience an increase in phlegm. SEEK IMMEDIATE MEDICAL CARE IF:  You feel little or no relief with your inhalers. You are still  wheezing and feeling shortness of breath, tightness in your chest, or both.  You have dizziness, headaches, or a fast heart rate.  You have chills, fever, or night sweats.  There is a noticeable increase in phlegm production, or there is blood in the phlegm. MAKE SURE YOU:  Understand these instructions.  Will watch your condition.  Will get help right away if you are not doing well or get worse.   This information is not intended to replace advice given to you by your health care provider. Make sure you discuss any questions you have with your health care provider.   Document Released: 04/24/2007 Document Revised: 07/18/2014 Document Reviewed: 12/13/2012 Elsevier Interactive Patient Education 2016 [employer]  

## 2015-07-27 NOTE — Progress Notes (Signed)
Subjective:  Patient ID: Linda Frederick, female    DOB: 07-08-1941  Age: 75 y.o. MRN: [id number]  CC: URI and Cough   HPI Linda Frederick presents   Patient has no history of asthma  Or use of an inhaler. She has nasal congestion and a postnasal drip. She has no new have a fever. She has no chills. Had normal appetite. She has  Exertional shortness of breath. She has generalized wheezing and a cough productive of mucopurulent sputum. She has no nausea vomiting or stoo change no rash.  History Ahyana has a past medical history of Cancer ([place]); Diabetes mellitus without complication ([place]); and Hypertension.   She has past surgical history that includes Breast surgery and Cholecystectomy.   Her  family history is not on file.  She   reports that she has never smoked. She does not have any smokeless tobacco history on file. She reports that she drinks alcohol. She reports that she does not use illicit drugs.  Outpatient Prescriptions Prior to Visit  Medication Sig Dispense Refill  . atenolol (TENORMIN) 25 MG tablet Take 25 mg by mouth daily.    Marland Kitchen atorvastatin (LIPITOR) 20 MG tablet Take 20 mg by mouth daily. Reported on 07/27/2015    . metFORMIN (GLUCOPHAGE) 500 MG tablet Take 500 mg by mouth 2 (two) times daily with a meal.    . amLODipine (NORVASC) 10 MG tablet Take 10 mg by mouth daily. Reported on 07/27/2015    . lisinopril (PRINIVIL,ZESTRIL) 10 MG tablet Take 10 mg by mouth daily. Reported on 07/27/2015    . Pseudoephedrine-Acetaminophen (SM NON-ASPRIN SINUS PO) Take by mouth. Reported on 07/27/2015    . UNKNOWN TO PATIENT Ointment given by dermatologist to put on fingers for an infection in the nail beds     No facility-administered medications prior to visit.    Social History   Social History  . Marital Status: Widowed    Spouse Name: N/A  . Number of Children: N/A  . Years of Education: N/A   Social History Main Topics  . Smoking status: Never Smoker   . Smokeless tobacco:  None  . Alcohol Use: Yes  . Drug Use: No  . Sexual Activity: Not Asked   Other Topics Concern  . None   Social History Narrative     Review of Systems  Constitutional: Positive for fever. Negative for chills and appetite change.  HENT: Positive for postnasal drip. Negative for congestion, ear pain, sinus pressure and sore throat.   Eyes: Negative for pain and redness.  Respiratory: Positive for cough, shortness of breath (with exertion) and wheezing.   Cardiovascular: Negative for leg swelling.  Gastrointestinal: Negative for nausea, vomiting, abdominal pain, diarrhea, constipation and blood in stool.  Endocrine: Negative for polyuria.  Genitourinary: Negative for dysuria, urgency, frequency and flank pain.  Musculoskeletal: Negative for gait problem.  Skin: Negative for rash.  Neurological: Negative for weakness and headaches.  Psychiatric/Behavioral: Negative for confusion and decreased concentration. The patient is not nervous/anxious.     Objective:  BP 124/78 mmHg  Pulse 60  Temp(Src) 98.4 F (36.9 C) (Oral)  Resp 17  Ht 5\' 4"  (1.626 m)  Wt 178 lb (80.74 kg)  BMI 30.54 kg/m2  SpO2 98%  Physical Exam  Constitutional: She is oriented to person, place, and time. She appears well-developed and well-nourished. No distress.  HENT:  Head: Normocephalic and atraumatic.  Right Ear: External ear normal.  Left Ear: External ear normal.  Nose: Nose normal.  Eyes: Conjunctivae and EOM are normal. Pupils are equal, round, and reactive to light. No scleral icterus.  Neck: Normal range of motion. Neck supple. No tracheal deviation present.  Cardiovascular: Normal rate, regular rhythm and normal heart sounds.   Pulmonary/Chest: Effort normal. No respiratory distress. She has wheezes. She has no rales.  Abdominal: She exhibits no mass. There is no tenderness. There is no rebound and no guarding.  Musculoskeletal: She exhibits no edema.  Lymphadenopathy:    She has no cervical  adenopathy.  Neurological: She is alert and oriented to person, place, and time. Coordination normal.  Skin: Skin is warm and dry. No rash noted.  Psychiatric: She has a normal mood and affect. Her behavior is normal.      Assessment & Plan:   Linda Frederick was seen today for uri and cough.  Diagnoses and all orders for this visit:  Bronchitis, acute, with bronchospasm -     albuterol (PROVENTIL) (2.5 MG/3ML) 0.083% nebulizer solution 5 mg; Take 6 mLs (5 mg total) by nebulization once. -     ipratropium (ATROVENT) nebulizer solution 0.5 mg; Take 2.5 mLs (0.5 mg total) by nebulization once.  Other orders -     azithromycin (ZITHROMAX) 250 MG tablet; Take 2 tabs PO x 1 dose, then 1 tab PO QD x 4 days -     chlorpheniramine-HYDROcodone (TUSSIONEX PENNKINETIC ER) 10-8 MG/5ML SUER; Take 5 mLs by mouth 2 (two) times daily. -     albuterol (PROVENTIL HFA;VENTOLIN HFA) 108 (90 Base) MCG/ACT inhaler; Inhale 2 puffs into the lungs every 4 (four) hours as needed for wheezing or shortness of breath (cough, shortness of breath or wheezing.).  I have discontinued Ms. Soward's amLODipine, UNKNOWN TO PATIENT, lisinopril, and Pseudoephedrine-Acetaminophen (SM NON-ASPRIN SINUS PO). I am also having her start on azithromycin, chlorpheniramine-HYDROcodone, and albuterol. Additionally, I am having her maintain her metFORMIN, atorvastatin, atenolol, triamterene-hydrochlorothiazide, losartan, levothyroxine, diclofenac, and Pseudoephedrine-Guaifenesin (MUCUS D PO). We administered albuterol and ipratropium.  Meds ordered this encounter  Medications  . triamterene-hydrochlorothiazide (DYAZIDE) 37.5-25 MG capsule    Sig: Take 1 capsule by mouth daily.  Marland Kitchen losartan (COZAAR) 50 MG tablet    Sig: Take 50 mg by mouth daily.  Marland Kitchen levothyroxine (SYNTHROID, LEVOTHROID) 50 MCG tablet    Sig: Take 50 mcg by mouth daily before breakfast.  . diclofenac (VOLTAREN) 75 MG EC tablet    Sig: Take 75 mg by mouth 2 (two) times daily.  .  Pseudoephedrine-Guaifenesin (MUCUS D PO)    Sig: Take by mouth.  Marland Kitchen albuterol (PROVENTIL) (2.5 MG/3ML) 0.083% nebulizer solution 5 mg    Sig:   . ipratropium (ATROVENT) nebulizer solution 0.5 mg    Sig:   . azithromycin (ZITHROMAX) 250 MG tablet    Sig: Take 2 tabs PO x 1 dose, then 1 tab PO QD x 4 days    Dispense:  6 tablet    Refill:  0  . chlorpheniramine-HYDROcodone (TUSSIONEX PENNKINETIC ER) 10-8 MG/5ML SUER    Sig: Take 5 mLs by mouth 2 (two) times daily.    Dispense:  60 mL    Refill:  0  . albuterol (PROVENTIL HFA;VENTOLIN HFA) 108 (90 Base) MCG/ACT inhaler    Sig: Inhale 2 puffs into the lungs every 4 (four) hours as needed for wheezing or shortness of breath (cough, shortness of breath or wheezing.).    Dispense:  1 Inhaler    Refill:  1    she cleared modestly with  the nebulized aerosol was put on an albuterol inhaler as well as  a Z-Pak with Tussionex. She'll follow-up as needed for new or worsened symptoms  Appropriate red flag conditions were discussed with the patient as well as actions that should be taken.  Patient expressed his understanding.  Follow-up: Return if symptoms worsen or fail to improve.  Roselee Culver, MD

## 2016-01-26 ENCOUNTER — Other Ambulatory Visit: Payer: Self-pay | Admitting: Internal Medicine

## 2016-01-26 ENCOUNTER — Ambulatory Visit
Admission: RE | Admit: 2016-01-26 | Discharge: 2016-01-26 | Disposition: A | Discharge status: Home / Self Care | Payer: Medicare Other | Source: Ambulatory Visit | Attending: Internal Medicine | Admitting: Internal Medicine

## 2016-01-26 DIAGNOSIS — M25571 Pain in right ankle and joints of right foot: Secondary | ICD-10-CM

## 2017-03-08 ENCOUNTER — Other Ambulatory Visit: Payer: Self-pay | Admitting: Internal Medicine

## 2017-03-08 ENCOUNTER — Ambulatory Visit
Admission: RE | Admit: 2017-03-08 | Discharge: 2017-03-08 | Disposition: A | Discharge status: Home / Self Care | Payer: Medicare Other | Source: Ambulatory Visit | Attending: Internal Medicine | Admitting: Internal Medicine

## 2017-03-08 DIAGNOSIS — M549 Dorsalgia, unspecified: Secondary | ICD-10-CM

## 2017-04-11 ENCOUNTER — Other Ambulatory Visit: Payer: Self-pay | Admitting: Internal Medicine

## 2017-04-11 ENCOUNTER — Ambulatory Visit
Admission: RE | Admit: 2017-04-11 | Discharge: 2017-04-11 | Disposition: A | Discharge status: Home / Self Care | Payer: Medicare Other | Source: Ambulatory Visit | Attending: Internal Medicine | Admitting: Internal Medicine

## 2017-04-11 DIAGNOSIS — R0602 Shortness of breath: Secondary | ICD-10-CM

## 2017-05-01 ENCOUNTER — Other Ambulatory Visit: Payer: Self-pay | Admitting: Orthopedic Surgery

## 2017-05-01 DIAGNOSIS — M545 Low back pain: Secondary | ICD-10-CM

## 2017-05-01 DIAGNOSIS — M5416 Radiculopathy, lumbar region: Secondary | ICD-10-CM

## 2017-05-16 ENCOUNTER — Ambulatory Visit
Admission: RE | Admit: 2017-05-16 | Discharge: 2017-05-16 | Disposition: A | Discharge status: Home / Self Care | Payer: Medicare Other | Source: Ambulatory Visit | Attending: Orthopedic Surgery | Admitting: Orthopedic Surgery

## 2017-05-16 DIAGNOSIS — M5416 Radiculopathy, lumbar region: Secondary | ICD-10-CM

## 2017-05-16 DIAGNOSIS — M545 Low back pain: Secondary | ICD-10-CM

## 2018-02-22 ENCOUNTER — Other Ambulatory Visit: Payer: Self-pay | Admitting: Neurosurgery

## 2018-02-28 ENCOUNTER — Other Ambulatory Visit: Payer: Self-pay

## 2018-02-28 ENCOUNTER — Encounter (HOSPITAL_COMMUNITY): Payer: Self-pay | Admitting: *Deleted

## 2018-02-28 NOTE — Progress Notes (Signed)
Pt denies SOB, chest pain, and being under the care of a cardiologist. Pt denies having an echo and cardiac cath but stated that a stress test was performed. Pt stated that an EKG was performed within the last year. Requested LOV note, EKG stress test , labs and any cardiac studies from Dr. Lorene Dy, PCP. Pt stated that MD advised that she stop taking  Aspirin, vitamins, fish oil and herbal medications. Do not take any NSAIDs ie: Ibuprofen, Advil, Naproxen (Aleve), Motrin, BC and Goody Powder. Pt made aware to not take Metformin on DOS. Pt stated that she does not check her blood glucose. Pt verbalized understanding of all pre-op instructions.

## 2018-03-02 ENCOUNTER — Inpatient Hospital Stay (HOSPITAL_COMMUNITY): Payer: Medicare Other | Admitting: Anesthesiology

## 2018-03-02 ENCOUNTER — Encounter (HOSPITAL_COMMUNITY)
Admission: RE | Disposition: A | Discharge status: Home / Self Care | Payer: Self-pay | Source: Ambulatory Visit | Attending: Neurosurgery

## 2018-03-02 ENCOUNTER — Encounter (HOSPITAL_COMMUNITY): Payer: Self-pay | Admitting: *Deleted

## 2018-03-02 ENCOUNTER — Inpatient Hospital Stay (HOSPITAL_COMMUNITY): Payer: Medicare Other

## 2018-03-02 ENCOUNTER — Inpatient Hospital Stay (HOSPITAL_COMMUNITY)
Admission: RE | Admit: 2018-03-02 | Discharge: 2018-03-03 | DRG: 517 | Disposition: A | Discharge status: Home / Self Care | Payer: Medicare Other | Source: Ambulatory Visit | Attending: Neurosurgery | Admitting: Neurosurgery

## 2018-03-02 DIAGNOSIS — Z87891 Personal history of nicotine dependence: Secondary | ICD-10-CM | POA: Diagnosis not present

## 2018-03-02 DIAGNOSIS — Z7951 Long term (current) use of inhaled steroids: Secondary | ICD-10-CM | POA: Diagnosis not present

## 2018-03-02 DIAGNOSIS — Z7984 Long term (current) use of oral hypoglycemic drugs: Secondary | ICD-10-CM

## 2018-03-02 DIAGNOSIS — E119 Type 2 diabetes mellitus without complications: Secondary | ICD-10-CM | POA: Diagnosis present

## 2018-03-02 DIAGNOSIS — E039 Hypothyroidism, unspecified: Secondary | ICD-10-CM | POA: Diagnosis present

## 2018-03-02 DIAGNOSIS — Z79899 Other long term (current) drug therapy: Secondary | ICD-10-CM | POA: Diagnosis not present

## 2018-03-02 DIAGNOSIS — I10 Essential (primary) hypertension: Secondary | ICD-10-CM | POA: Diagnosis present

## 2018-03-02 DIAGNOSIS — Z885 Allergy status to narcotic agent status: Secondary | ICD-10-CM | POA: Diagnosis not present

## 2018-03-02 DIAGNOSIS — K219 Gastro-esophageal reflux disease without esophagitis: Secondary | ICD-10-CM | POA: Diagnosis present

## 2018-03-02 DIAGNOSIS — Z419 Encounter for procedure for purposes other than remedying health state, unspecified: Secondary | ICD-10-CM

## 2018-03-02 DIAGNOSIS — M7130 Other bursal cyst, unspecified site: Secondary | ICD-10-CM | POA: Diagnosis present

## 2018-03-02 DIAGNOSIS — M5416 Radiculopathy, lumbar region: Secondary | ICD-10-CM | POA: Diagnosis present

## 2018-03-02 DIAGNOSIS — M7138 Other bursal cyst, other site: Principal | ICD-10-CM | POA: Diagnosis present

## 2018-03-02 HISTORY — DX: Other bursal cyst, unspecified site: M71.30

## 2018-03-02 HISTORY — DX: Anemia, unspecified: D64.9

## 2018-03-02 HISTORY — PX: LUMBAR LAMINECTOMY/DECOMPRESSION MICRODISCECTOMY: SHX5026

## 2018-03-02 HISTORY — DX: Headache, unspecified: R51.9

## 2018-03-02 HISTORY — DX: Presence of spectacles and contact lenses: Z97.3

## 2018-03-02 HISTORY — DX: Headache: R51

## 2018-03-02 HISTORY — DX: Hypothyroidism, unspecified: E03.9

## 2018-03-02 HISTORY — DX: Family history of other specified conditions: Z84.89

## 2018-03-02 HISTORY — DX: Gastro-esophageal reflux disease without esophagitis: K21.9

## 2018-03-02 HISTORY — DX: Personal history of urinary calculi: Z87.442

## 2018-03-02 HISTORY — DX: Unspecified osteoarthritis, unspecified site: M19.90

## 2018-03-02 LAB — BASIC METABOLIC PANEL
ANION GAP: 10 (ref 5–15)
BUN: 32 mg/dL — ABNORMAL HIGH (ref 8–23)
CALCIUM: 9.4 mg/dL (ref 8.9–10.3)
CO2: 24 mmol/L (ref 22–32)
CREATININE: 1.55 mg/dL — AB (ref 0.44–1.00)
Chloride: 108 mmol/L (ref 98–111)
GFR, EST AFRICAN AMERICAN: 37 mL/min — AB (ref >60)
GFR, EST NON AFRICAN AMERICAN: 32 mL/min — AB (ref >60)
Glucose, Bld: 98 mg/dL (ref 70–99)
Potassium: 4.9 mmol/L (ref 3.5–5.1)
Sodium: 142 mmol/L (ref 135–145)

## 2018-03-02 LAB — SURGICAL PCR SCREEN
MRSA, PCR: NEGATIVE
Staphylococcus aureus: POSITIVE — AB

## 2018-03-02 LAB — CBC
HCT: 40.5 % (ref 36.0–46.0)
Hemoglobin: 12.5 g/dL (ref 12.0–15.0)
MCH: 27.4 pg (ref 26.0–34.0)
MCHC: 30.9 g/dL (ref 30.0–36.0)
MCV: 88.6 fL (ref 78.0–100.0)
PLATELETS: 210 10*3/uL (ref 150–400)
RBC: 4.57 MIL/uL (ref 3.87–5.11)
RDW: 14 % (ref 11.5–15.5)
WBC: 8.2 10*3/uL (ref 4.0–10.5)

## 2018-03-02 LAB — GLUCOSE, CAPILLARY
GLUCOSE-CAPILLARY: 214 mg/dL — AB (ref 70–99)
Glucose-Capillary: 101 mg/dL — ABNORMAL HIGH (ref 70–99)

## 2018-03-02 LAB — HEMOGLOBIN A1C
HEMOGLOBIN A1C: 5.7 % — AB (ref 4.8–5.6)
MEAN PLASMA GLUCOSE: 116.89 mg/dL

## 2018-03-02 SURGERY — LUMBAR LAMINECTOMY/DECOMPRESSION MICRODISCECTOMY 1 LEVEL
Anesthesia: General | Site: Back | Laterality: Left

## 2018-03-02 MED ORDER — MENTHOL 3 MG MT LOZG
1.0000 | LOZENGE | OROMUCOSAL | Status: DC | PRN
  Filled 2018-03-02: qty 9

## 2018-03-02 MED ORDER — OXYMETAZOLINE HCL 0.05 % NA SOLN
1.0000 | Freq: Every day | NASAL | Status: DC | PRN
  Filled 2018-03-02: qty 15

## 2018-03-02 MED ORDER — SODIUM CHLORIDE 0.9% FLUSH: 3.0000 mL | INTRAVENOUS | Status: DC | PRN

## 2018-03-02 MED ORDER — ONDANSETRON HCL 4 MG/2ML IJ SOLN
INTRAMUSCULAR | Status: DC | PRN
  Administered 2018-03-02: 4 mg via INTRAVENOUS

## 2018-03-02 MED ORDER — LOSARTAN POTASSIUM 50 MG PO TABS
50.0000 mg | ORAL_TABLET | ORAL | Status: AC
  Administered 2018-03-02: 50 mg via ORAL

## 2018-03-02 MED ORDER — LIDOCAINE-EPINEPHRINE 1 %-1:100000 IJ SOLN
INTRAMUSCULAR | Status: DC | PRN
  Administered 2018-03-02: 10 mL

## 2018-03-02 MED ORDER — ONDANSETRON HCL 4 MG/2ML IJ SOLN: 4.0000 mg | Freq: Once | INTRAMUSCULAR | Status: DC | PRN

## 2018-03-02 MED ORDER — ONDANSETRON HCL 4 MG/2ML IJ SOLN: 4.0000 mg | Freq: Four times a day (QID) | INTRAMUSCULAR | Status: DC | PRN

## 2018-03-02 MED ORDER — DEXAMETHASONE SODIUM PHOSPHATE 10 MG/ML IJ SOLN
INTRAMUSCULAR | Status: DC | PRN
  Administered 2018-03-02: 10 mg via INTRAVENOUS

## 2018-03-02 MED ORDER — ACETAMINOPHEN 650 MG RE SUPP: 650.0000 mg | RECTAL | Status: DC | PRN

## 2018-03-02 MED ORDER — HEMOSTATIC AGENTS (NO CHARGE) OPTIME
TOPICAL | Status: DC | PRN
  Administered 2018-03-02: 1 via TOPICAL

## 2018-03-02 MED ORDER — FENTANYL CITRATE (PF) 250 MCG/5ML IJ SOLN
INTRAMUSCULAR | Status: AC
  Filled 2018-03-02: qty 5

## 2018-03-02 MED ORDER — CYCLOBENZAPRINE HCL 10 MG PO TABS
ORAL_TABLET | ORAL | Status: AC
  Filled 2018-03-02: qty 1

## 2018-03-02 MED ORDER — FENTANYL CITRATE (PF) 100 MCG/2ML IJ SOLN: 25.0000 ug | INTRAMUSCULAR | Status: DC | PRN

## 2018-03-02 MED ORDER — BUPIVACAINE HCL (PF) 0.25 % IJ SOLN
INTRAMUSCULAR | Status: DC | PRN
  Administered 2018-03-02: 10 mL

## 2018-03-02 MED ORDER — CEFAZOLIN SODIUM-DEXTROSE 2-4 GM/100ML-% IV SOLN
2.0000 g | Freq: Three times a day (TID) | INTRAVENOUS | Status: AC
  Administered 2018-03-02 – 2018-03-03 (×2): 2 g via INTRAVENOUS
  Filled 2018-03-02 (×2): qty 100

## 2018-03-02 MED ORDER — FENTANYL CITRATE (PF) 100 MCG/2ML IJ SOLN
25.0000 ug | INTRAMUSCULAR | Status: DC | PRN
  Administered 2018-03-02 (×2): 50 ug via INTRAVENOUS

## 2018-03-02 MED ORDER — CYCLOBENZAPRINE HCL 10 MG PO TABS
10.0000 mg | ORAL_TABLET | Freq: Three times a day (TID) | ORAL | Status: DC | PRN
  Administered 2018-03-02 – 2018-03-03 (×3): 10 mg via ORAL
  Filled 2018-03-02 (×2): qty 1

## 2018-03-02 MED ORDER — ACETAMINOPHEN 500 MG PO TABS: 500.0000 mg | ORAL_TABLET | Freq: Four times a day (QID) | ORAL | Status: DC | PRN

## 2018-03-02 MED ORDER — ROCURONIUM BROMIDE 50 MG/5ML IV SOSY
PREFILLED_SYRINGE | INTRAVENOUS | Status: DC | PRN
  Administered 2018-03-02: 50 mg via INTRAVENOUS

## 2018-03-02 MED ORDER — MUPIROCIN 2 % EX OINT
TOPICAL_OINTMENT | CUTANEOUS | Status: AC
  Filled 2018-03-02: qty 22

## 2018-03-02 MED ORDER — ONDANSETRON HCL 4 MG/2ML IJ SOLN
INTRAMUSCULAR | Status: AC
  Filled 2018-03-02: qty 4

## 2018-03-02 MED ORDER — THROMBIN 5000 UNITS EX SOLR
CUTANEOUS | Status: DC | PRN
  Administered 2018-03-02: 10000 [IU] via TOPICAL

## 2018-03-02 MED ORDER — PHENYLEPHRINE HCL 10 MG/ML IJ SOLN
INTRAMUSCULAR | Status: DC | PRN
  Administered 2018-03-02: 80 ug via INTRAVENOUS

## 2018-03-02 MED ORDER — LEVOTHYROXINE SODIUM 100 MCG PO TABS
50.0000 ug | ORAL_TABLET | Freq: Every day | ORAL | Status: DC
  Administered 2018-03-03: 50 ug via ORAL
  Filled 2018-03-02: qty 1

## 2018-03-02 MED ORDER — ATORVASTATIN CALCIUM 20 MG PO TABS
20.0000 mg | ORAL_TABLET | Freq: Every day | ORAL | Status: DC
  Administered 2018-03-02: 20 mg via ORAL
  Filled 2018-03-02: qty 1

## 2018-03-02 MED ORDER — ONDANSETRON HCL 4 MG PO TABS: 4.0000 mg | ORAL_TABLET | Freq: Four times a day (QID) | ORAL | Status: DC | PRN

## 2018-03-02 MED ORDER — PANTOPRAZOLE SODIUM 40 MG PO TBEC
80.0000 mg | DELAYED_RELEASE_TABLET | Freq: Every day | ORAL | Status: DC
  Administered 2018-03-03: 80 mg via ORAL
  Filled 2018-03-02: qty 2

## 2018-03-02 MED ORDER — LACTATED RINGERS IV SOLN
INTRAVENOUS | Status: DC
  Administered 2018-03-02: 12:00:00 via INTRAVENOUS

## 2018-03-02 MED ORDER — ATENOLOL 25 MG PO TABS
25.0000 mg | ORAL_TABLET | Freq: Every day | ORAL | Status: DC
  Administered 2018-03-02: 25 mg via ORAL
  Filled 2018-03-02: qty 1

## 2018-03-02 MED ORDER — OXYCODONE HCL 5 MG PO TABS
10.0000 mg | ORAL_TABLET | ORAL | Status: DC | PRN
  Administered 2018-03-02 – 2018-03-03 (×3): 10 mg via ORAL
  Filled 2018-03-02 (×2): qty 2

## 2018-03-02 MED ORDER — LIDOCAINE 2% (20 MG/ML) 5 ML SYRINGE
INTRAMUSCULAR | Status: AC
  Filled 2018-03-02: qty 10

## 2018-03-02 MED ORDER — FENTANYL CITRATE (PF) 100 MCG/2ML IJ SOLN
INTRAMUSCULAR | Status: DC | PRN
  Administered 2018-03-02 (×2): 50 ug via INTRAVENOUS

## 2018-03-02 MED ORDER — ROCURONIUM BROMIDE 50 MG/5ML IV SOSY
PREFILLED_SYRINGE | INTRAVENOUS | Status: AC
  Filled 2018-03-02: qty 10

## 2018-03-02 MED ORDER — SUGAMMADEX SODIUM 200 MG/2ML IV SOLN
INTRAVENOUS | Status: DC | PRN
  Administered 2018-03-02: 200 mg via INTRAVENOUS

## 2018-03-02 MED ORDER — SODIUM CHLORIDE 0.9 % IV SOLN
INTRAVENOUS | Status: DC | PRN
  Administered 2018-03-02: 14:00:00

## 2018-03-02 MED ORDER — BUPIVACAINE HCL (PF) 0.25 % IJ SOLN
INTRAMUSCULAR | Status: AC
  Filled 2018-03-02: qty 30

## 2018-03-02 MED ORDER — OXYCODONE HCL 5 MG PO TABS
ORAL_TABLET | ORAL | Status: AC
  Filled 2018-03-02: qty 2

## 2018-03-02 MED ORDER — METFORMIN HCL 500 MG PO TABS
500.0000 mg | ORAL_TABLET | Freq: Every day | ORAL | Status: DC
  Administered 2018-03-03: 500 mg via ORAL
  Filled 2018-03-02: qty 1

## 2018-03-02 MED ORDER — LIDOCAINE-EPINEPHRINE 1 %-1:100000 IJ SOLN
INTRAMUSCULAR | Status: AC
  Filled 2018-03-02: qty 1

## 2018-03-02 MED ORDER — PROPOFOL 10 MG/ML IV BOLUS
INTRAVENOUS | Status: DC | PRN
  Administered 2018-03-02: 150 mg via INTRAVENOUS

## 2018-03-02 MED ORDER — GLYCOPYRROLATE 0.2 MG/ML IJ SOLN
INTRAMUSCULAR | Status: DC | PRN
  Administered 2018-03-02: 0.2 mg via INTRAVENOUS

## 2018-03-02 MED ORDER — HYDROMORPHONE HCL 1 MG/ML IJ SOLN: 0.5000 mg | INTRAMUSCULAR | Status: DC | PRN

## 2018-03-02 MED ORDER — ACETAMINOPHEN 325 MG PO TABS: 650.0000 mg | ORAL_TABLET | ORAL | Status: DC | PRN

## 2018-03-02 MED ORDER — SODIUM CHLORIDE 0.9% FLUSH: 3.0000 mL | Freq: Two times a day (BID) | INTRAVENOUS | Status: DC

## 2018-03-02 MED ORDER — DEXAMETHASONE SODIUM PHOSPHATE 10 MG/ML IJ SOLN
INTRAMUSCULAR | Status: AC
  Filled 2018-03-02: qty 2

## 2018-03-02 MED ORDER — SODIUM CHLORIDE 0.9 % IV SOLN
INTRAVENOUS | Status: DC | PRN
  Administered 2018-03-02: 20 ug/min via INTRAVENOUS

## 2018-03-02 MED ORDER — PROPOFOL 10 MG/ML IV BOLUS
INTRAVENOUS | Status: AC
  Filled 2018-03-02: qty 20

## 2018-03-02 MED ORDER — PHENOL 1.4 % MT LIQD: 1.0000 | OROMUCOSAL | Status: DC | PRN

## 2018-03-02 MED ORDER — FENTANYL CITRATE (PF) 100 MCG/2ML IJ SOLN
INTRAMUSCULAR | Status: AC
  Filled 2018-03-02: qty 2

## 2018-03-02 MED ORDER — CEFAZOLIN SODIUM-DEXTROSE 1-4 GM/50ML-% IV SOLN
INTRAVENOUS | Status: DC | PRN
  Administered 2018-03-02: 2 g via INTRAVENOUS

## 2018-03-02 MED ORDER — 0.9 % SODIUM CHLORIDE (POUR BTL) OPTIME
TOPICAL | Status: DC | PRN
  Administered 2018-03-02: 1000 mL

## 2018-03-02 MED ORDER — LACTATED RINGERS IV SOLN
INTRAVENOUS | Status: DC | PRN
  Administered 2018-03-02 (×2): via INTRAVENOUS

## 2018-03-02 MED ORDER — ALUM & MAG HYDROXIDE-SIMETH 200-200-20 MG/5ML PO SUSP: 30.0000 mL | Freq: Four times a day (QID) | ORAL | Status: DC | PRN

## 2018-03-02 MED ORDER — LIDOCAINE 2% (20 MG/ML) 5 ML SYRINGE
INTRAMUSCULAR | Status: DC | PRN
  Administered 2018-03-02: 40 mg via INTRAVENOUS

## 2018-03-02 MED ORDER — THROMBIN 5000 UNITS EX SOLR
CUTANEOUS | Status: AC
  Filled 2018-03-02: qty 10000

## 2018-03-02 MED ORDER — LOSARTAN POTASSIUM 50 MG PO TABS
50.0000 mg | ORAL_TABLET | Freq: Every day | ORAL | Status: DC
  Filled 2018-03-02: qty 1

## 2018-03-02 MED ORDER — TRIAMTERENE-HCTZ 37.5-25 MG PO TABS
1.0000 | ORAL_TABLET | Freq: Every day | ORAL | Status: DC
  Filled 2018-03-02 (×2): qty 1

## 2018-03-02 SURGICAL SUPPLY — 56 items
ADH SKN CLS APL DERMABOND .7 (GAUZE/BANDAGES/DRESSINGS) ×1
APL SKNCLS STERI-STRIP NONHPOA (GAUZE/BANDAGES/DRESSINGS) ×1
BAG DECANTER FOR FLEXI CONT (MISCELLANEOUS) ×3 IMPLANT
BENZOIN TINCTURE PRP APPL 2/3 (GAUZE/BANDAGES/DRESSINGS) ×3 IMPLANT
BLADE CLIPPER SURG (BLADE) IMPLANT
BLADE SURG 11 STRL SS (BLADE) ×3 IMPLANT
BUR CUTTER 7.0 ROUND (BURR) ×3 IMPLANT
BUR MATCHSTICK NEURO 3.0 LAGG (BURR) ×3 IMPLANT
CANISTER SUCT 3000ML PPV (MISCELLANEOUS) ×3 IMPLANT
CARTRIDGE OIL MAESTRO DRILL (MISCELLANEOUS) ×1 IMPLANT
CLOSURE WOUND 1/2 X4 (GAUZE/BANDAGES/DRESSINGS) ×1
DECANTER SPIKE VIAL GLASS SM (MISCELLANEOUS) ×3 IMPLANT
DERMABOND ADVANCED (GAUZE/BANDAGES/DRESSINGS) ×2
DERMABOND ADVANCED .7 DNX12 (GAUZE/BANDAGES/DRESSINGS) ×1 IMPLANT
DIFFUSER DRILL AIR PNEUMATIC (MISCELLANEOUS) ×3 IMPLANT
DRAPE HALF SHEET 40X57 (DRAPES) ×2 IMPLANT
DRAPE LAPAROTOMY 100X72X124 (DRAPES) ×3 IMPLANT
DRAPE MICROSCOPE LEICA (MISCELLANEOUS) ×3 IMPLANT
DRAPE SURG 17X23 STRL (DRAPES) ×3 IMPLANT
DRSG OPSITE POSTOP 4X6 (GAUZE/BANDAGES/DRESSINGS) ×2 IMPLANT
DURAPREP 26ML APPLICATOR (WOUND CARE) ×3 IMPLANT
ELECT REM PT RETURN 9FT ADLT (ELECTROSURGICAL) ×3
ELECTRODE REM PT RTRN 9FT ADLT (ELECTROSURGICAL) ×1 IMPLANT
GAUZE 4X4 16PLY RFD (DISPOSABLE) IMPLANT
GAUZE SPONGE 4X4 12PLY STRL (GAUZE/BANDAGES/DRESSINGS) ×3 IMPLANT
GLOVE BIO SURGEON STRL SZ7 (GLOVE) IMPLANT
GLOVE BIO SURGEON STRL SZ8 (GLOVE) ×3 IMPLANT
GLOVE BIOGEL PI IND STRL 7.0 (GLOVE) IMPLANT
GLOVE BIOGEL PI INDICATOR 7.0 (GLOVE)
GLOVE EXAM NITRILE LRG STRL (GLOVE) IMPLANT
GLOVE EXAM NITRILE XL STR (GLOVE) IMPLANT
GLOVE EXAM NITRILE XS STR PU (GLOVE) IMPLANT
GLOVE INDICATOR 8.5 STRL (GLOVE) ×3 IMPLANT
GOWN STRL REUS W/ TWL LRG LVL3 (GOWN DISPOSABLE) ×1 IMPLANT
GOWN STRL REUS W/ TWL XL LVL3 (GOWN DISPOSABLE) ×2 IMPLANT
GOWN STRL REUS W/TWL 2XL LVL3 (GOWN DISPOSABLE) IMPLANT
GOWN STRL REUS W/TWL LRG LVL3 (GOWN DISPOSABLE) ×3
GOWN STRL REUS W/TWL XL LVL3 (GOWN DISPOSABLE) ×6
KIT BASIN OR (CUSTOM PROCEDURE TRAY) ×3 IMPLANT
KIT TURNOVER KIT B (KITS) ×3 IMPLANT
NDL SPNL 22GX3.5 QUINCKE BK (NEEDLE) ×1 IMPLANT
NEEDLE HYPO 22GX1.5 SAFETY (NEEDLE) ×3 IMPLANT
NEEDLE SPNL 22GX3.5 QUINCKE BK (NEEDLE) ×3 IMPLANT
NS IRRIG 1000ML POUR BTL (IV SOLUTION) ×3 IMPLANT
OIL CARTRIDGE MAESTRO DRILL (MISCELLANEOUS) ×3
PACK LAMINECTOMY NEURO (CUSTOM PROCEDURE TRAY) ×3 IMPLANT
RUBBERBAND STERILE (MISCELLANEOUS) ×6 IMPLANT
SPONGE SURGIFOAM ABS GEL SZ50 (HEMOSTASIS) ×3 IMPLANT
STRIP CLOSURE SKIN 1/2X4 (GAUZE/BANDAGES/DRESSINGS) ×2 IMPLANT
SUT VIC AB 0 CT1 18XCR BRD8 (SUTURE) ×1 IMPLANT
SUT VIC AB 0 CT1 8-18 (SUTURE) ×3
SUT VIC AB 2-0 CT1 18 (SUTURE) ×3 IMPLANT
SUT VICRYL 4-0 PS2 18IN ABS (SUTURE) ×3 IMPLANT
TOWEL GREEN STERILE (TOWEL DISPOSABLE) ×3 IMPLANT
TOWEL GREEN STERILE FF (TOWEL DISPOSABLE) ×3 IMPLANT
WATER STERILE IRR 1000ML POUR (IV SOLUTION) ×3 IMPLANT

## 2018-03-02 NOTE — H&P (Signed)
Linda Frederick is an 77 y.o. female.   Chief Complaint: Back and left leg pain HPI: 77 year old female with back left hip and leg pain posterior lateral thigh lateral shin and top of foot and big toe consistent with an L5 nerve root pattern.  Occasionally she has a little bit of pain in the formation but the vast majority of it seems to be L5.  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  I recommended laminectomy for resection of synovial cyst.  I do not think we need to do an extra foraminal decompression.  Extra foraminal discectomy at this level in the setting of doing a synovial cyst significantly runs a risk of destabilizing that segment and I do not think that her predominance of symptoms.  So explained all this to the patient and her family and have extensively reviewed the risks and benefits of the operation as well as perioperative course expectations of outcome and alternatives of surgery and she understands and agrees to proceed forward.  Past Medical History:  Diagnosis Date  . Anemia    PMH: Iron deficiency anemia  . Arthritis   . Cancer ([place])   . Diabetes mellitus without complication ([place])   . Family history of adverse reaction to anesthesia     " My mother always had a problem waking up. "  . GERD (gastroesophageal reflux disease)   . Headache   . History of kidney stones   . Hypertension   . Hypothyroidism   . Synovial cyst    lumbar  . Wears glasses     Past Surgical History:  Procedure Laterality Date  . APPENDECTOMY    . BREAST SURGERY    . CATARACT EXTRACTION W/ INTRAOCULAR LENS  IMPLANT, BILATERAL    . CHOLECYSTECTOMY    . COLONOSCOPY WITH ESOPHAGOGASTRODUODENOSCOPY (EGD)      Family History  Problem Relation Age of Onset  . Colon cancer Mother   . Prostate cancer Father    Social History:  reports that she has quit smoking. Her  smoking use included cigarettes. She has never used smokeless tobacco. She reports that she drinks alcohol. She reports that she does not use drugs.  Allergies:  Allergies  Allergen Reactions  . Bee Venom Itching and Swelling  . Macrodantin [Nitrofurantoin Macrocrystal] Nausea And Vomiting and Other (See Comments)    Also causes chest pain  . Codeine Nausea And Vomiting    Medications Prior to Admission  Medication Sig Dispense Refill  . acetaminophen (TYLENOL) 500 MG tablet Take 500 mg by mouth every 6 (six) hours as needed for moderate pain.    Marland Kitchen atenolol (TENORMIN) 25 MG tablet Take 25 mg by mouth daily.    Marland Kitchen atorvastatin (LIPITOR) 20 MG tablet Take 20 mg by mouth daily.     Marland Kitchen levothyroxine (SYNTHROID, LEVOTHROID) 50 MCG tablet Take 50 mcg by mouth daily before breakfast.    . losartan (COZAAR) 50 MG tablet Take 50 mg by mouth daily.    . metFORMIN (GLUCOPHAGE) 500 MG tablet Take 500 mg by mouth daily with breakfast.     . omeprazole (PRILOSEC) 20 MG capsule Take 20 mg by mouth daily.    Marland Kitchen oxymetazoline (AFRIN) 0.05 % nasal spray Place 1 spray into both nostrils daily as needed for congestion.    . triamcinolone cream (KENALOG) 0.1 % Apply 1 application  topically daily as needed (itching).    . triamterene-hydrochlorothiazide (MAXZIDE-25) 37.5-25 MG tablet Take 1 tablet by mouth daily.     Marland Kitchen albuterol (PROVENTIL HFA;VENTOLIN HFA) 108 (90 Base) MCG/ACT inhaler Inhale 2 puffs into the lungs every 4 (four) hours as needed for wheezing or shortness of breath (cough, shortness of breath or wheezing.). (Patient not taking: Reported on 02/23/2018) 1 Inhaler 1  . azithromycin (ZITHROMAX) 250 MG tablet Take 2 tabs PO x 1 dose, then 1 tab PO QD x 4 days (Patient not taking: Reported on 02/23/2018) 6 tablet 0  . chlorpheniramine-HYDROcodone (TUSSIONEX PENNKINETIC ER) 10-8 MG/5ML SUER Take 5 mLs by mouth 2 (two) times daily. (Patient not taking: Reported on 02/23/2018) 60 mL 0    Results for orders  placed or performed during the hospital encounter of 03/02/18 (from the past 48 hour(s))  Glucose, capillary     Status: Abnormal   Collection Time: 03/02/18 11:00 AM  Result Value Ref Range   Glucose-Capillary 101 (H) 70 - 99 mg/dL   No results found.  Review of Systems  Musculoskeletal: Positive for back pain.  Neurological: Positive for tingling and sensory change.    Blood pressure (!) 178/53, pulse (!) 51, temperature 98.4 F (36.9 C), temperature source Oral, height 5\' 4"  (1.626 m), weight 78.9 kg, SpO2 98 %. Physical Exam  Constitutional: She is oriented to person, place, and time. She appears well-developed and well-nourished.  HENT:  Head: Normocephalic.  Eyes: Pupils are equal, round, and reactive to light.  Neck: Normal range of motion.  Respiratory: Effort normal.  GI: Soft. Bowel sounds are normal.  Musculoskeletal: Normal range of motion.  Neurological: She is alert and oriented to person, place, and time. She has normal strength. GCS eye subscore is 4. GCS verbal subscore is 5. GCS motor subscore is 6.  Strength 5 out of 5 iliopsoas, quads, hamstrings, gastroc, and tibialis, EHL.     Assessment/Plan 77-year female presents for a laminectomy for resection of synovial cyst L4-5 left  Rosellen Lichtenberger P, MD 03/02/2018, 12:13 PM

## 2018-03-02 NOTE — Anesthesia Preprocedure Evaluation (Addendum)
Anesthesia Evaluation  Patient identified by MRN, date of birth, ID band Patient awake    Reviewed: Allergy & Precautions, NPO status , Patient's Chart, lab work & pertinent test results, Unable to perform ROS - Chart review only  Airway Mallampati: III  TM Distance: >3 FB     Dental  (+) Dental Advidsory Given, Teeth Intact   Pulmonary former smoker,    breath sounds clear to auscultation       Cardiovascular hypertension,  Rhythm:regular     Neuro/Psych  Headaches,    GI/Hepatic GERD  Medicated and Controlled,  Endo/Other  diabetesHypothyroidism   Renal/GU      Musculoskeletal  (+) Arthritis ,   Abdominal   Peds  Hematology  (+) anemia ,   Anesthesia Other Findings   Reproductive/Obstetrics                            Anesthesia Physical Anesthesia Plan  ASA: II  Anesthesia Plan: General   Post-op Pain Management:    Induction: Intravenous  PONV Risk Score and Plan: Ondansetron and Dexamethasone  Airway Management Planned: Oral ETT  Additional Equipment:   Intra-op Plan:   Post-operative Plan: Extubation in OR  Informed Consent:   Dental Advisory Given  Plan Discussed with: CRNA and Anesthesiologist  Anesthesia Plan Comments:        Anesthesia Quick Evaluation

## 2018-03-02 NOTE — Anesthesia Procedure Notes (Signed)
Procedure Name: Intubation Date/Time: 03/02/2018 12:59 PM Performed by: Neldon Newport, CRNA Pre-anesthesia Checklist: Timeout performed, Patient being monitored, Suction available, Emergency Drugs available and Patient identified Patient Re-evaluated:Patient Re-evaluated prior to induction Oxygen Delivery Method: Circle system utilized Preoxygenation: Pre-oxygenation with 100% oxygen Induction Type: IV induction Ventilation: Mask ventilation without difficulty Laryngoscope Size: Mac and 3 Grade View: Grade I Tube type: Oral Tube size: 7.0 mm Number of attempts: 1 Placement Confirmation: breath sounds checked- equal and bilateral,  positive ETCO2 and ETT inserted through vocal cords under direct vision Secured at: 21 cm Tube secured with: Tape Dental Injury: Teeth and Oropharynx as per pre-operative assessment

## 2018-03-02 NOTE — Anesthesia Postprocedure Evaluation (Signed)
Anesthesia Post Note  Patient: Linda Frederick  Procedure(s) Performed: Microdiscectomy- extraforaminal, synovial cyst removal - Lumbar Four-Lumbar Five - left (Left Back)     Patient location during evaluation: PACU Anesthesia Type: General Level of consciousness: awake and alert Pain management: pain level controlled Vital Signs Assessment: post-procedure vital signs reviewed and stable Respiratory status: spontaneous breathing, nonlabored ventilation, respiratory function stable and patient connected to nasal cannula oxygen Cardiovascular status: blood pressure returned to baseline and stable Postop Assessment: no apparent nausea or vomiting Anesthetic complications: no    Last Vitals:  Vitals:   03/02/18 1700 03/02/18 1734  BP: (!) 127/55 (!) 176/65  Pulse: (!) 52 (!) 53  Resp: 12 18  Temp: 36.4 C   SpO2: 93% 96%    Last Pain:  Vitals:   03/02/18 1700  TempSrc:   PainSc: Asleep                 Brooklyne Radke COKER

## 2018-03-02 NOTE — Op Note (Signed)
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  Due to patient's failed conservative treatment imaging findings and progression of clinical syndrome I recommended laminectomy for resection of synovial cyst.  I extensively went over the risks and benefits of the operation with her as well as perioperative course expectations of outcome and alternatives of surgery and she understood and agreed to proceed forward.  Operative procedure: Patient brought into the ER was used in general anesthesia positioned prone Wilson frame her back was prepped and draped in routine sterile fashion.  Preoperative x-ray localized the appropriate level so after infiltration of 10 cc lidocaine with epi midline incision was made and Bovie left cautery was used to down subcu tissue and subperiosteal dissection was carried lamina of L4 and L5 on the left confirmed by intraoperative x-ray.  Then the inferior aspect of the bottom third of the L4 lamina was drilled and with a high-speed drill as well as medial facet complex superior aspect of lamina L5.  Laminotomy was begun ligament flow was identified and removed in piecemeal fashion immediately identified was a large synovial cyst causing severe thecal sac compression just above the takeoff of the L5 nerve root.  So utilizing a 4 Penfield nerve hook and dental dissection the cyst was dissected off of the dura very carefully and removed in piecemeal fashion.  Marching superiorly this extends up to the inferior aspect of  the 4 pedicle and inferiorly down to the medial aspect of the 5 pedicle.  I dissected all the cyst membrane off the medial facet wall as well as off the dura however there is a small amount of membrane I believe on the outer surface of the L5 nerve root that was densely adherent.  Make sure the L5 foramen was widely decompressed I was also able to palpate distally at the L4 foramen.  There is no further stenosis either on the thecal sac or the foramina.  Oozing copious irrigated extremities was maintained Gelfoam was awake top of the dura and the muscle fascia approximate layers with Vicryl skin was closed running 4 subcuticular Dermabond benzoin Steri-Strips and a sterile dressing was applied patient recovery in stable condition.  At the end the case on the account sponge counts were correct.

## 2018-03-02 NOTE — Transfer of Care (Signed)
Immediate Anesthesia Transfer of Care Note  Patient: Linda Frederick  Procedure(s) Performed: Microdiscectomy- extraforaminal, synovial cyst removal - Lumbar Four-Lumbar Five - left (Left Back)  Patient Location: PACU  Anesthesia Type:General  Level of Consciousness: awake, alert  and oriented  Airway & Oxygen Therapy: Patient Spontanous Breathing and Patient connected to nasal cannula oxygen  Post-op Assessment: Report given to RN, Post -op Vital signs reviewed and stable and Patient moving all extremities X 4  Post vital signs: Reviewed and stable  Last Vitals:  Vitals Value Taken Time  BP 178/68 03/02/2018  2:44 PM  Temp    Pulse 64 03/02/2018  2:46 PM  Resp 18 03/02/2018  2:46 PM  SpO2 100 % 03/02/2018  2:46 PM  Vitals shown include unvalidated device data.  Last Pain:  Vitals:   03/02/18 1126  TempSrc:   PainSc: 2          Complications: No apparent anesthesia complications

## 2018-03-03 ENCOUNTER — Encounter (HOSPITAL_COMMUNITY): Payer: Self-pay | Admitting: Neurosurgery

## 2018-03-03 LAB — GLUCOSE, CAPILLARY: Glucose-Capillary: 115 mg/dL — ABNORMAL HIGH (ref 70–99)

## 2018-03-03 MED ORDER — METHOCARBAMOL 500 MG PO TABS: 500.0000 mg | ORAL_TABLET | Freq: Four times a day (QID) | ORAL | 0 refills | Status: DC

## 2018-03-03 MED ORDER — HYDROCODONE-ACETAMINOPHEN 5-325 MG PO TABS: 1.0000 | ORAL_TABLET | ORAL | 0 refills | Status: AC | PRN

## 2018-03-03 NOTE — Progress Notes (Signed)
Patient alert and oriented, mae's well, voiding adequate amount of urine, swallowing without difficulty, no c/o pain at time of discharge. Patient discharged home with family. Script and discharged instructions given to patient. Patient and family stated understanding of instructions given. Patient has an appointment with Dr. Cram 

## 2018-03-03 NOTE — Progress Notes (Signed)
No CC44 needed, patient may DC.

## 2018-03-03 NOTE — Discharge Summary (Signed)
Physician Discharge Summary  Patient ID: PARA COSSEY MRN: [id number] DOB/AGE: 75-Jun-1942 75 y.o.  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 The patient tolerated the procedure well and was taken to the recovery room and then to the floor in stable condition. The hospital course was routine. There were no complications. The wound remained clean dry and intact. Pt had appropriate back soreness. No complaints of leg pain or new N/T/W. The patient remained afebrile with stable vital signs, and tolerated a regular diet. The patient continued to increase activities, and pain was well controlled with oral pain medications.   Consults: None  Significant Diagnostic Studies:  Results for orders placed or performed during the hospital encounter of 03/02/18  Surgical pcr screen  Result Value Ref Range   MRSA, PCR NEGATIVE NEGATIVE   Staphylococcus aureus POSITIVE (A) NEGATIVE  Glucose, capillary  Result Value Ref Range   Glucose-Capillary 101 (H) 70 - 99 mg/dL  CBC  Result Value Ref Range   WBC 8.2 4.0 - 10.5 K/uL   RBC 4.57 3.87 - 5.11 MIL/uL   Hemoglobin 12.5 12.0 - 15.0 g/dL   HCT 40.5 36.0 - 46.0 %   MCV 88.6 78.0 - 100.0 fL   MCH 27.4 26.0 - 34.0 pg   MCHC 30.9 30.0 - 36.0 g/dL   RDW 14.0 11.5 - 15.5 %   Platelets 210 150 - 400 K/uL  Basic metabolic panel  Result Value Ref Range   Sodium 142 135 - 145 mmol/L   Potassium 4.9 3.5 - 5.1 mmol/L   Chloride 108 98 - 111 mmol/L   CO2 24 22 - 32 mmol/L   Glucose, Bld 98 70 - 99 mg/dL   BUN 32 (H) 8 - 23 mg/dL   Creatinine, Ser 1.55 (H) 0.44 - 1.00 mg/dL   Calcium 9.4 8.9 - 10.3 mg/dL   GFR calc non Af Amer 32 (L) >60 mL/min   GFR calc Af Amer 37 (L) >60  mL/min   Anion gap 10 5 - 15  Hemoglobin A1c  Result Value Ref Range   Hgb A1c MFr Bld 5.7 (H) 4.8 - 5.6 %   Mean Plasma Glucose 116.89 mg/dL  Glucose, capillary  Result Value Ref Range   Glucose-Capillary 214 (H) 70 - 99 mg/dL   Comment 1 Notify RN    Comment 2 Document in Chart   Glucose, capillary  Result Value Ref Range   Glucose-Capillary 115 (H) 70 - 99 mg/dL   Comment 1 Notify RN    Comment 2 Document in Chart     Dg Lumbar Spine 2-3 Views  Result Date: 03/02/2018 CLINICAL DATA:  Intraoperative localization films for patient having a synovial cyst resected from the L4-5 level. EXAM: LUMBAR SPINE - 2-3 VIEW COMPARISON:  MRI lumbar spine 05/16/2017. FINDINGS: Three intraoperative views of the lumbar spine in lateral projection are provided. On the first image, a probe is at the level the inferior aspect of the L4 levels. On the second image, there is a clamp on the L4 spinous process and a probe directed just below the level of the L4 pedicles. On the final image, a clamp is now on the L5 spinous process with a probe at the level of the inferior aspect of  the L5 pedicles. IMPRESSION: Localization as above. Electronically Signed   By: Inge Rise M.D.   On: 03/02/2018 15:04    Antibiotics:  Anti-infectives (From admission, onward)   Start     Dose/Rate Route Frequency Ordered Stop   03/02/18 2100  ceFAZolin (ANCEF) IVPB 2g/100 mL premix     2 g 200 mL/hr over 30 Minutes Intravenous Every 8 hours 03/02/18 1741 03/03/18 0524   03/02/18 1338  bacitracin 50,000 Units in sodium chloride 0.9 % 500 mL irrigation  Status:  Discontinued       As needed 03/02/18 1338 03/02/18 1439      Discharge Exam: Blood pressure 133/68, pulse (!) 52, temperature 97.7 F (36.5 C), temperature source Oral, resp. rate 17, height 5\' 4"  (1.626 m), weight 78.9 kg, SpO2 97 %. Neurologic: Grossly normal Ambulating and voiding well  Discharge Medications:   Allergies as of 03/03/2018      Reactions    Bee Venom Itching, Swelling   Macrodantin [nitrofurantoin Macrocrystal] Nausea And Vomiting, Other (See Comments)   Also causes chest pain   Codeine Nausea And Vomiting      Medication List    TAKE these medications   acetaminophen 500 MG tablet Commonly known as:  TYLENOL Take 500 mg by mouth every 6 (six) hours as needed for moderate pain.   albuterol 108 (90 Base) MCG/ACT inhaler Commonly known as:  PROVENTIL HFA;VENTOLIN HFA Inhale 2 puffs into the lungs every 4 (four) hours as needed for wheezing or shortness of breath (cough, shortness of breath or wheezing.).   atenolol 25 MG tablet Commonly known as:  TENORMIN Take 25 mg by mouth daily.   atorvastatin 20 MG tablet Commonly known as:  LIPITOR Take 20 mg by mouth daily.   azithromycin 250 MG tablet Commonly known as:  ZITHROMAX Take 2 tabs PO x 1 dose, then 1 tab PO QD x 4 days   chlorpheniramine-HYDROcodone 10-8 MG/5ML Suer Commonly known as:  TUSSIONEX Take 5 mLs by mouth 2 (two) times daily.   HYDROcodone-acetaminophen 5-325 MG tablet Commonly known as:  NORCO/VICODIN Take 1 tablet by mouth every 4 (four) hours as needed for moderate pain.   levothyroxine 50 MCG tablet Commonly known as:  SYNTHROID, LEVOTHROID Take 50 mcg by mouth daily before breakfast.   losartan 50 MG tablet Commonly known as:  COZAAR Take 50 mg by mouth daily.   metFORMIN 500 MG tablet Commonly known as:  GLUCOPHAGE Take 500 mg by mouth daily with breakfast.   methocarbamol 500 MG tablet Commonly known as:  ROBAXIN Take 1 tablet (500 mg total) by mouth 4 (four) times daily.   omeprazole 20 MG capsule Commonly known as:  PRILOSEC Take 20 mg by mouth daily.   oxymetazoline 0.05 % nasal spray Commonly known as:  AFRIN Place 1 spray into both nostrils daily as needed for congestion.   triamcinolone cream 0.1 % Commonly known as:  KENALOG Apply 1 application topically daily as needed (itching).    triamterene-hydrochlorothiazide 37.5-25 MG tablet Commonly known as:  MAXZIDE-25 Take 1 tablet by mouth daily.       Disposition: home   Final Dx: synovial cyst removal l4-5  Discharge Instructions     Remove dressing in 72 hours   Complete by:  As directed    Call MD for:  difficulty breathing, headache or visual disturbances   Complete by:  As directed    Call MD for:  hives   Complete by:  As directed  Call MD for:  persistant dizziness or light-headedness   Complete by:  As directed    Call MD for:  persistant nausea and vomiting   Complete by:  As directed    Call MD for:  redness, tenderness, or signs of infection (pain, swelling, redness, odor or green/yellow discharge around incision site)   Complete by:  As directed    Call MD for:  severe uncontrolled pain   Complete by:  As directed    Call MD for:  temperature >100.4   Complete by:  As directed    Diet - low sodium heart healthy   Complete by:  As directed    Driving Restrictions   Complete by:  As directed    No driving 2 weeks   Increase activity slowly   Complete by:  As directed    Lifting restrictions   Complete by:  As directed    Nothing heavier than 8 lbs         Signed: Ocie Cornfield Meyran 03/03/2018, 8:22 AM

## 2018-07-16 ENCOUNTER — Other Ambulatory Visit: Payer: Self-pay | Admitting: Internal Medicine

## 2018-07-16 ENCOUNTER — Ambulatory Visit
Admission: RE | Admit: 2018-07-16 | Discharge: 2018-07-16 | Disposition: A | Discharge status: Home / Self Care | Payer: Medicare Other | Source: Ambulatory Visit | Attending: Internal Medicine | Admitting: Internal Medicine

## 2018-07-16 DIAGNOSIS — M79641 Pain in right hand: Secondary | ICD-10-CM

## 2019-08-06 ENCOUNTER — Ambulatory Visit: Payer: Medicare Other

## 2019-08-15 ENCOUNTER — Ambulatory Visit: Payer: Medicare PPO | Attending: Internal Medicine

## 2019-08-15 DIAGNOSIS — Z23 Encounter for immunization: Secondary | ICD-10-CM

## 2019-08-15 NOTE — Progress Notes (Signed)
   Covid-19 Vaccination Clinic  Name:  Linda Frederick    MRN: [id number] DOB: October 30, 1940  08/15/2019  [name] was observed post Covid-19 immunization for 15 minutes without incidence. She was provided with Vaccine Information Sheet and instruction to access the V-Safe system.   [name] was instructed to call 911 with any severe reactions post vaccine: Marland Kitchen Difficulty breathing  . Swelling of your face and throat  . A fast heartbeat  . A bad rash all over your body  . Dizziness and weakness    Immunizations Administered    Name Date Dose VIS Date Route   Pfizer COVID-19 Vaccine 08/15/2019  5:56 PM 0.3 mL 06/21/2019 Intramuscular   Manufacturer: [place]   Lot: [id number]   [place]: [id number]

## 2019-08-20 ENCOUNTER — Ambulatory Visit
Admission: RE | Admit: 2019-08-20 | Discharge: 2019-08-20 | Disposition: A | Discharge status: Home / Self Care | Payer: Medicare PPO | Source: Ambulatory Visit | Attending: Internal Medicine | Admitting: Internal Medicine

## 2019-08-20 ENCOUNTER — Other Ambulatory Visit: Payer: Self-pay | Admitting: Internal Medicine

## 2019-08-20 DIAGNOSIS — R0602 Shortness of breath: Secondary | ICD-10-CM

## 2019-08-31 IMAGING — CR DG LUMBAR SPINE 2-3V
3 series · 3 of 3 positions shown · non-contrast
Comparison: MRI lumbar spine 05/16/2017.

CLINICAL DATA: Intraoperative localization films for patient having
a synovial cyst resected from the L4-5 level.

EXAM:
LUMBAR SPINE - 2-3 VIEW

[lateral (1 of 3)]
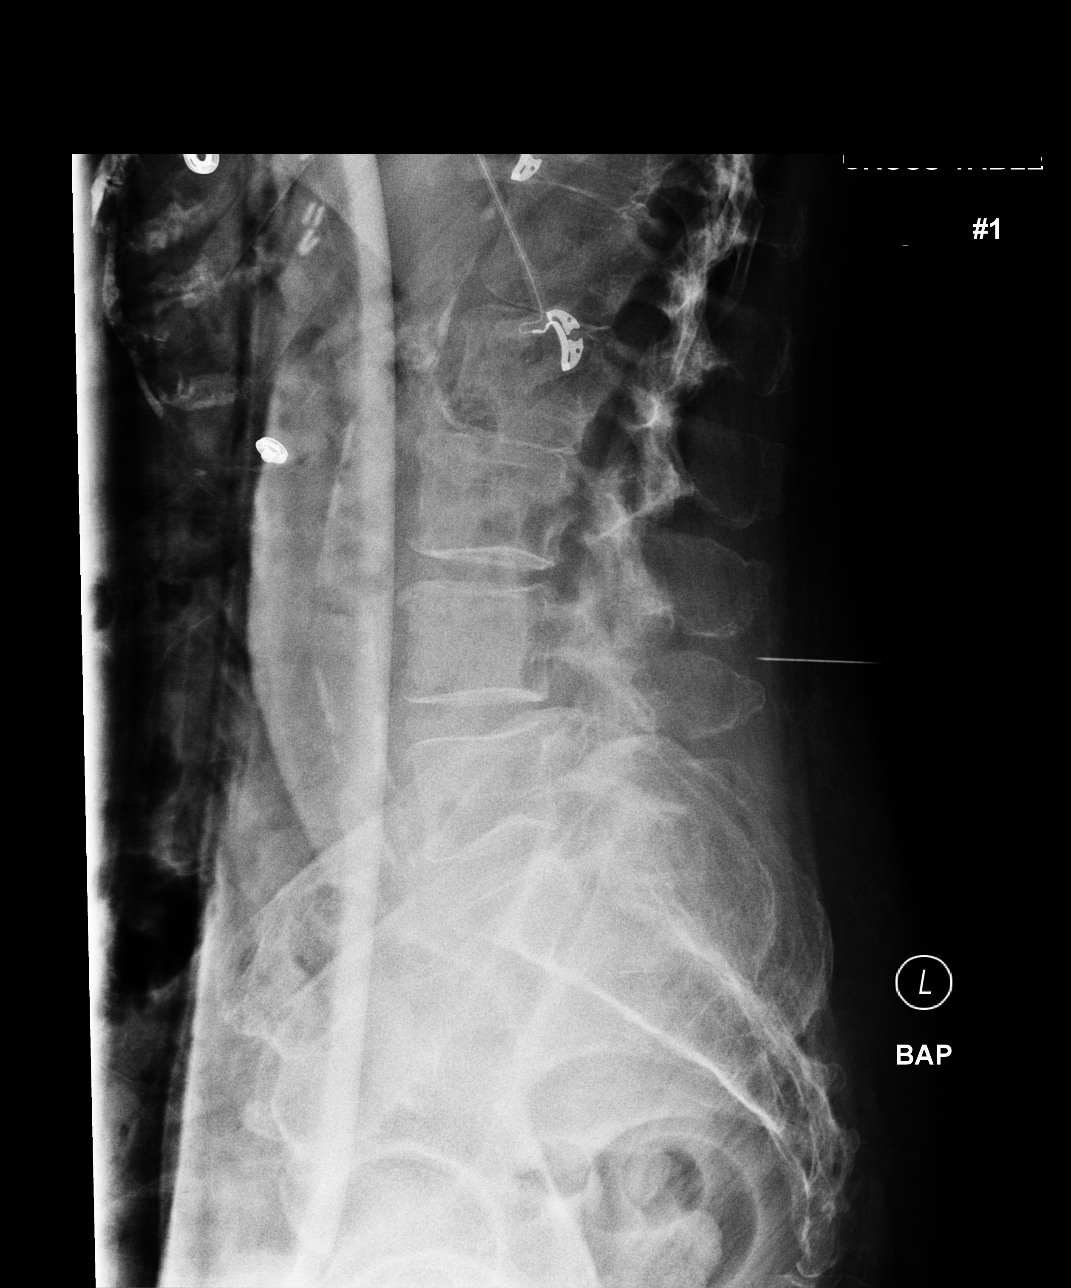

[lateral (2 of 3)]
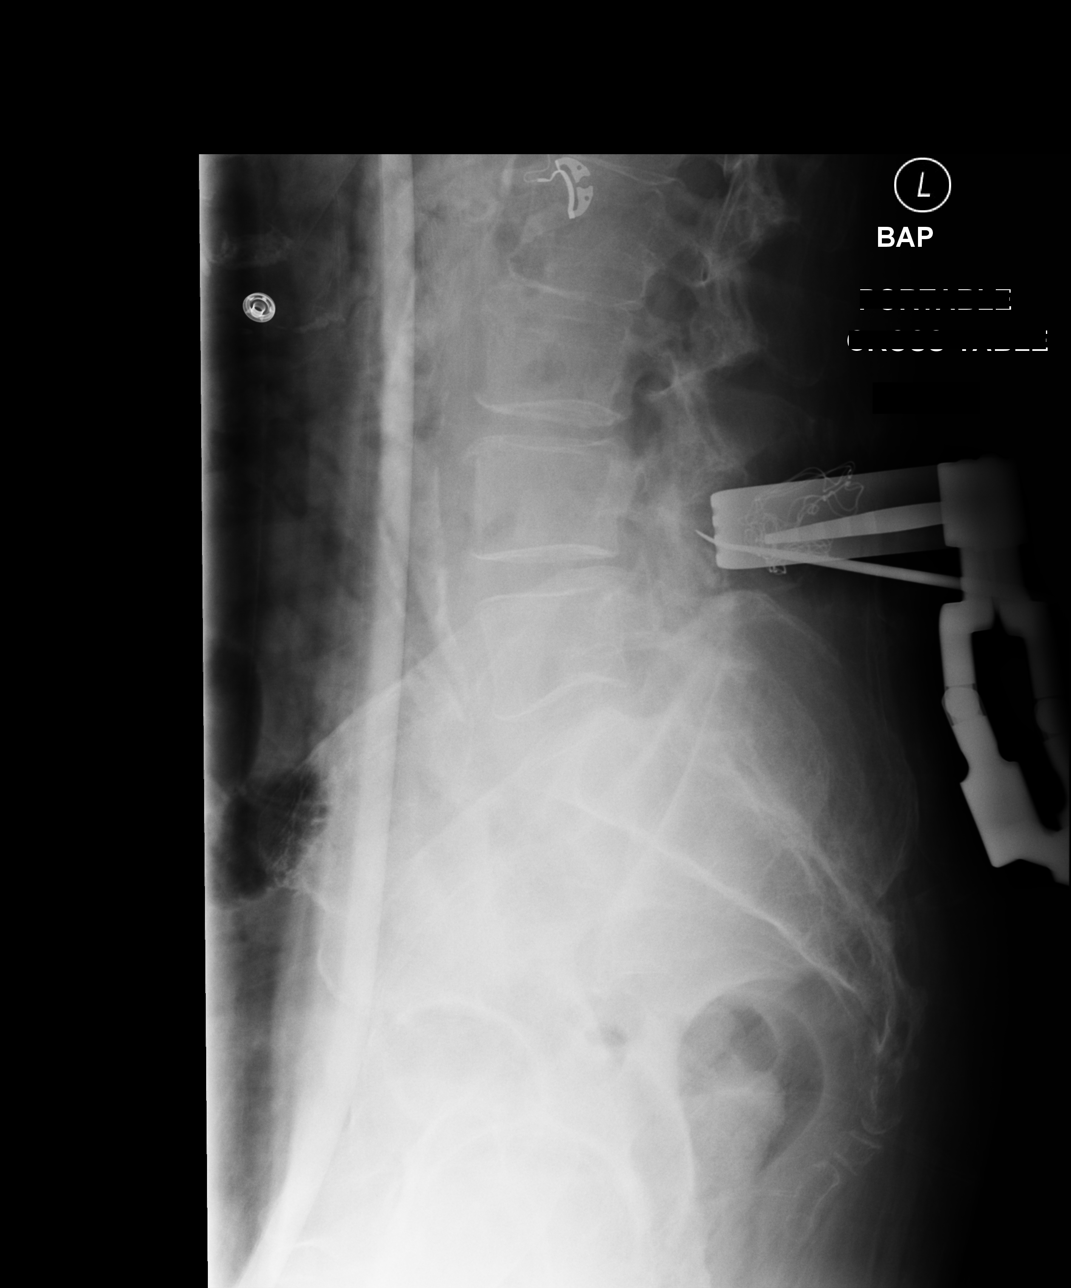

[lateral (3 of 3)]
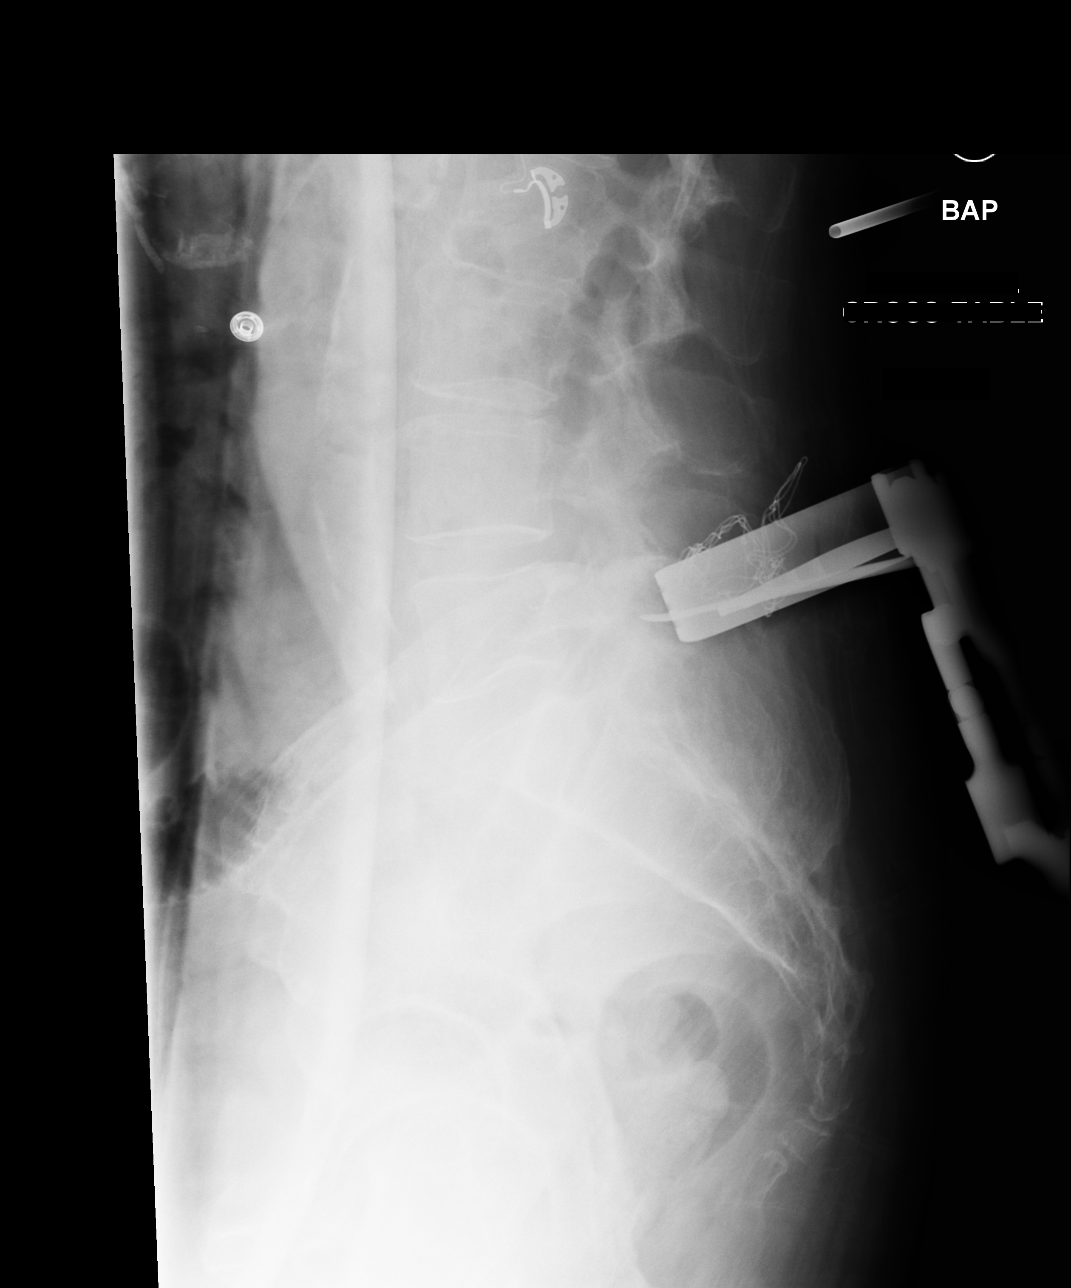

[3 of 3 positions shown; findings below may reference images not displayed]

FINDINGS: Three intraoperative views of the lumbar spine in lateral projection
are provided. On the first image, a probe is at the level the
inferior aspect of the L4 levels. On the second image, there is a
clamp on the L4 spinous process and a probe directed just below the
level of the L4 pedicles. On the final image, a clamp is now on the
L5 spinous process with a probe at the level of the inferior aspect
of the L5 pedicles.
IMPRESSION: Localization as above.

## 2019-09-10 ENCOUNTER — Ambulatory Visit: Payer: Medicare PPO | Attending: Internal Medicine

## 2019-09-10 DIAGNOSIS — Z23 Encounter for immunization: Secondary | ICD-10-CM

## 2019-09-10 NOTE — Progress Notes (Signed)
   Covid-19 Vaccination Clinic  Name:  FALICIA IMRIE    MRN: [id number] DOB: 09/25/1940  09/10/2019  Ms. Lorenson was observed post Covid-19 immunization for 15 minutes without incident. She was provided with Vaccine Information Sheet and instruction to access the V-Safe system.   Ms. Ragle was instructed to call 911 with any severe reactions post vaccine: Marland Kitchen Difficulty breathing  . Swelling of face and throat  . A fast heartbeat  . A bad rash all over body  . Dizziness and weakness   Immunizations Administered    Name Date Dose VIS Date Route   Pfizer COVID-19 Vaccine 09/10/2019 10:54 AM 0.3 mL 06/21/2019 Intramuscular   Manufacturer: [place]   Lot: [id number]   [place]: [id number]

## 2020-07-27 NOTE — Patient Instructions (Addendum)
DUE TO COVID-19 ONLY ONE VISITOR IS ALLOWED TO COME WITH YOU AND STAY IN THE WAITING ROOM ONLY DURING PRE OP AND PROCEDURE DAY OF SURGERY. THE 1 VISITOR  MAY VISIT WITH YOU AFTER SURGERY IN YOUR PRIVATE ROOM DURING VISITING HOURS ONLY!  YOU NEED TO HAVE A COVID 19 TEST ON_1/21______ @_12 :05______, THIS TEST MUST BE DONE BEFORE SURGERY,  COVID TESTING SITE [address] [place] , IT IS ON THE RIGHT GOING OUT [address] APPROXIMATELY  2 MINUTES PAST [employer] ON THE RIGHT. ONCE YOUR COVID TEST IS COMPLETED,  PLEASE BEGIN THE QUARANTINE INSTRUCTIONS AS OUTLINED IN YOUR HANDOUT. Call if bad weather [phone]                ALVILDA MCKENNA   Your procedure is scheduled on: 08/04/20   Report to [facility] Main  Entrance   Report to admitting at   8:25 AM     Call this number if you have problems the morning of surgery [phone]   BRUSH YOUR TEETH MORNING OF SURGERY AND RINSE YOUR MOUTH OUT, NO CHEWING GUM CANDY OR MINTS.   No food after midnight.    You may have clear liquid until 7:30 AM.    At 7:00 AM drink pre surgery drink.   Nothing by mouth after 7:30 AM.    Take these medicines the morning of surgery with A SIP OF WATER: Levothyroxine,   How to Manage Your Diabetes Before and After Surgery  Why is it important to control my blood sugar before and after surgery?  Improving blood sugar levels before and after surgery helps healing and can limit problems.  A way of improving blood sugar control is eating a healthy diet by: o  Eating less sugar and carbohydrates o  Increasing activity/exercise o  Talking with your doctor about reaching your blood sugar goals  High blood sugars (greater than 180 mg/dL) can raise your risk of infections and slow your recovery, so you will need to focus on controlling your diabetes during the weeks before surgery.  Make sure that the doctor who takes care of your diabetes knows about your planned  surgery including the date and location.  How do I manage my blood sugar before surgery?  Check your blood sugar at least 4 times a day, starting 2 days before surgery, to make sure that the level is not too high or low. o Check your blood sugar the morning of your surgery when you wake up and every 2 hours until you get to the Short Stay unit.  If your blood sugar is less than 70 mg/dL, you will need to treat for low blood sugar: o Do not take insulin. o Treat a low blood sugar (less than 70 mg/dL) with  cup of clear juice (cranberry or apple), 4 glucose tablets, OR glucose gel. o Recheck blood sugar in 15 minutes after treatment (to make sure it is greater than 70 mg/dL). If your blood sugar is not greater than 70 mg/dL on recheck, call [phone] for further instructions.  Report your blood sugar to the short stay nurse when you get to Short Stay.   If you are admitted to the hospital after surgery: o Your blood sugar will be checked by the staff and you will probably be given insulin after surgery (instead of oral diabetes medicines) to make sure you have good blood sugar levels. o The goal for blood sugar control after surgery is 80-180  mg/dL.   WHAT DO I DO ABOUT MY DIABETES MEDICATION?   Do not take oral diabetes medicines (pills) the morning of surgery.                                 You may not have any metal on your body including hair pins and              piercings  Do not wear jewelry, make-up, lotions, powders or perfumes, deodorant             Do not wear nail polish on your fingernails.  Do not shave  48 hours prior to surgery.              Do not bring valuables to the hospital. [place].  Contacts, dentures or bridgework may not be worn into surgery.      Patients discharged the day of surgery will not be allowed to drive home.   IF YOU ARE HAVING SURGERY AND GOING HOME THE SAME DAY, YOU MUST HAVE AN ADULT  TO DRIVE YOU HOME AND BE WITH YOU FOR 24 HOURS.  YOU MAY GO HOME BY TAXI OR UBER OR ORTHERWISE, BUT AN ADULT MUST ACCOMPANY YOU HOME AND STAY WITH YOU FOR 24 HOURS.  Name and phone number of your driver:  Special Instructions: N/A              Please read over the following fact sheets you were given: _____________________________________________________________________             [facility] - Preparing for Surgery Before surgery, you can play an important role.   Because skin is not sterile, your skin needs to be as free of germs as possible.   You can reduce the number of germs on your skin by washing with CHG (chlorahexidine gluconate) soap before surgery.   CHG is an antiseptic cleaner which kills germs and bonds with the skin to continue killing germs even after washing. Please DO NOT use if you have an allergy to CHG or antibacterial soaps.   If your skin becomes reddened/irritated stop using the CHG and inform your nurse when you arrive at Short Stay. Do not shave (including legs and underarms) for at least 48 hours prior to the first CHG shower.   Please follow these instructions carefully:   1.  Shower with CHG Soap the night before surgery and the  morning of Surgery.  2.  If you choose to wash your hair, wash your hair first as usual with your  normal  shampoo.  3.  After you shampoo, rinse your hair and body thoroughly to remove the  shampoo.                                        4.  Use CHG as you would any other liquid soap.  You can apply chg directly  to the skin and wash                       Gently with a scrungie or clean washcloth.  5.  Apply the CHG Soap to your body ONLY FROM THE NECK DOWN.   Do not use  on face/ open                           Wound or open sores. Avoid contact with eyes, ears mouth and genitals (private parts).                       Wash face,  Genitals (private parts) with your normal soap.             6.  Wash thoroughly, paying special  attention to the area where your surgery  will be performed.  7.  Thoroughly rinse your body with warm water from the neck down.  8.  DO NOT shower/wash with your normal soap after using and rinsing off  the CHG Soap.             9.  Pat yourself dry with a clean towel.            10.  Wear clean pajamas.            11.  Place clean sheets on your bed the night of your first shower and do not  sleep with pets. Day of Surgery : Do not apply any lotions/deodorants the morning of surgery.  Please wear clean clothes to the hospital/surgery center.  FAILURE TO FOLLOW THESE INSTRUCTIONS MAY RESULT IN THE CANCELLATION OF YOUR SURGERY PATIENT SIGNATURE_________________________________  NURSE SIGNATURE__________________________________  ________________________________________________________________________   Adam Phenix  An incentive spirometer is a tool that can help keep your lungs clear and active. This tool measures how well you are filling your lungs with each breath. Taking long deep breaths may help reverse or decrease the chance of developing breathing (pulmonary) problems (especially infection) following:  A long period of time when you are unable to move or be active. BEFORE THE PROCEDURE   If the spirometer includes an indicator to show your best effort, your nurse or respiratory therapist will set it to a desired goal.  If possible, sit up straight or lean slightly forward. Try not to slouch.  Hold the incentive spirometer in an upright position. INSTRUCTIONS FOR USE  1. Sit on the edge of your bed if possible, or sit up as far as you can in bed or on a chair. 2. Hold the incentive spirometer in an upright position. 3. Breathe out normally. 4. Place the mouthpiece in your mouth and seal your lips tightly around it. 5. Breathe in slowly and as deeply as possible, raising the piston or the ball toward the top of the column. 6. Hold your breath for 3-5 seconds or for as  long as possible. Allow the piston or ball to fall to the bottom of the column. 7. Remove the mouthpiece from your mouth and breathe out normally. 8. Rest for a few seconds and repeat Steps 1 through 7 at least 10 times every 1-2 hours when you are awake. Take your time and take a few normal breaths between deep breaths. 9. The spirometer may include an indicator to show your best effort. Use the indicator as a goal to work toward during each repetition. 10. After each set of 10 deep breaths, practice coughing to be sure your lungs are clear. If you have an incision (the cut made at the time of surgery), support your incision when coughing by placing a pillow or rolled up towels firmly against it. Once you are able to get out of bed, walk around indoors and  cough well. You may stop using the incentive spirometer when instructed by your caregiver.  RISKS AND COMPLICATIONS  Take your time so you do not get dizzy or light-headed.  If you are in pain, you may need to take or ask for pain medication before doing incentive spirometry. It is harder to take a deep breath if you are having pain. AFTER USE  Rest and breathe slowly and easily.  It can be helpful to keep track of a log of your progress. Your caregiver can provide you with a simple table to help with this. If you are using the spirometer at home, follow these instructions: SEEK MEDICAL CARE IF:   You are having difficultly using the spirometer.  You have trouble using the spirometer as often as instructed.  Your pain medication is not giving enough relief while using the spirometer.  You develop fever of 100.5 F (38.1 C) or higher. SEEK IMMEDIATE MEDICAL CARE IF:   You cough up bloody sputum that had not been present before.  You develop fever of 102 F (38.9 C) or greater.  You develop worsening pain at or near the incision site. MAKE SURE YOU:   Understand these instructions.  Will watch your condition.  Will get help  right away if you are not doing well or get worse. Document Released: 11/07/2006 Document Revised: 09/19/2011 Document Reviewed: 01/08/2007 [facility] Patient Information 2014 [place], [place].   ________________________________________________________________________

## 2020-07-29 ENCOUNTER — Encounter (HOSPITAL_COMMUNITY): Payer: Self-pay

## 2020-07-29 ENCOUNTER — Other Ambulatory Visit: Payer: Self-pay

## 2020-07-29 ENCOUNTER — Encounter (HOSPITAL_COMMUNITY)
Admission: RE | Admit: 2020-07-29 | Discharge: 2020-07-29 | Disposition: A | Discharge status: Home / Self Care | Payer: Medicare PPO | Source: Ambulatory Visit | Attending: Orthopedic Surgery | Admitting: Orthopedic Surgery

## 2020-07-29 DIAGNOSIS — E118 Type 2 diabetes mellitus with unspecified complications: Secondary | ICD-10-CM | POA: Diagnosis not present

## 2020-07-29 DIAGNOSIS — Z01818 Encounter for other preprocedural examination: Secondary | ICD-10-CM | POA: Diagnosis not present

## 2020-07-29 LAB — CBC
HCT: 45.1 % (ref 36.0–46.0)
Hemoglobin: 14.7 g/dL (ref 12.0–15.0)
MCH: 27.8 pg (ref 26.0–34.0)
MCHC: 32.6 g/dL (ref 30.0–36.0)
MCV: 85.4 fL (ref 80.0–100.0)
Platelets: 220 10*3/uL (ref 150–400)
RBC: 5.28 MIL/uL — ABNORMAL HIGH (ref 3.87–5.11)
RDW: 13.8 % (ref 11.5–15.5)
WBC: 8.2 10*3/uL (ref 4.0–10.5)
nRBC: 0 % (ref 0.0–0.2)

## 2020-07-29 LAB — SURGICAL PCR SCREEN
MRSA, PCR: NEGATIVE
Staphylococcus aureus: POSITIVE — AB

## 2020-07-29 LAB — BASIC METABOLIC PANEL
Anion gap: 12 (ref 5–15)
BUN: 24 mg/dL — ABNORMAL HIGH (ref 8–23)
CO2: 26 mmol/L (ref 22–32)
Calcium: 9.2 mg/dL (ref 8.9–10.3)
Chloride: 102 mmol/L (ref 98–111)
Creatinine, Ser: 1.36 mg/dL — ABNORMAL HIGH (ref 0.44–1.00)
GFR, Estimated: 40 mL/min — ABNORMAL LOW (ref >60)
Glucose, Bld: 119 mg/dL — ABNORMAL HIGH (ref 70–99)
Potassium: 4.5 mmol/L (ref 3.5–5.1)
Sodium: 140 mmol/L (ref 135–145)

## 2020-07-29 LAB — GLUCOSE, CAPILLARY: Glucose-Capillary: 117 mg/dL — ABNORMAL HIGH (ref 70–99)

## 2020-07-29 LAB — HEMOGLOBIN A1C
Hgb A1c MFr Bld: 6 % — ABNORMAL HIGH (ref 4.8–5.6)
Mean Plasma Glucose: 125.5 mg/dL

## 2020-07-29 NOTE — Progress Notes (Signed)
COVID Vaccine Completed:yes Date COVID Vaccine completed:09/2019-booster 04/2020 COVID vaccine manufacturer: Pfizer      PCP - Dr. Suzan Garibaldi Cardiologist - no  Chest x-ray - no EKG - 12/10/19- requested 07/29/20 Stress Test -no ECHO - no Cardiac Cath - no Pacemaker/ICD device last checked:NA  Sleep Study - no CPAP -   Fasting Blood Sugar - Pt doesn't test Checks Blood Sugar _____ times a day  Blood Thinner Instructions:NA Aspirin Instructions: Last Dose:  Anesthesia review:   Patient denies shortness of breath, fever, cough and chest pain at PAT appointment  yes Patient verbalized understanding of instructions that were given to them at the PAT appointment. Patient was also instructed that they will need to review over the PAT instructions again at home before surgery.yes Pt doesn't climb stairs but reports no SOB doing housework or ADLs

## 2020-07-31 ENCOUNTER — Other Ambulatory Visit (HOSPITAL_COMMUNITY)
Admission: RE | Admit: 2020-07-31 | Discharge: 2020-07-31 | Disposition: A | Discharge status: Home / Self Care | Payer: Medicare PPO | Source: Ambulatory Visit | Attending: Orthopedic Surgery | Admitting: Orthopedic Surgery

## 2020-07-31 DIAGNOSIS — Z20822 Contact with and (suspected) exposure to covid-19: Secondary | ICD-10-CM | POA: Insufficient documentation

## 2020-07-31 DIAGNOSIS — Z01812 Encounter for preprocedural laboratory examination: Secondary | ICD-10-CM | POA: Diagnosis present

## 2020-07-31 LAB — SARS CORONAVIRUS 2 (TAT 6-24 HRS): SARS Coronavirus 2: NEGATIVE

## 2020-07-31 NOTE — H&P (Addendum)
KNEE ARTHROPLASTY ADMISSION H&P  Patient ID: Linda Frederick MRN: [id number] DOB/AGE: 78-Jun-1942 78 y.o.  Chief Complaint: right knee pain.  Planned Procedure Date: 08/05/19  Medical Clearance by Dr. Mancel Bale   HPI: Linda Frederick is a 80 y.o. 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 The patient has a history of pain and functional disability in the right knee due to arthritis and has failed non-surgical conservative treatments for greater than 12 weeks to include NSAID's and/or analgesics, corticosteriod injections, use of assistive devices and activity modification.  Onset of symptoms was gradual, starting 5 years ago with gradually worsening course since that time. The patient noted no past surgery on the right knee.  Patient currently rates pain at 3 out of 10 with activity. Patient has night pain, worsening of pain with activity and weight bearing, crepitus and joint swelling.  Patient has evidence of joint space narrowing by imaging studies.  There is no active infection.  Past Medical History:  Diagnosis Date  . Anemia    PMH: Iron deficiency anemia  . Arthritis   . Cancer ([place])   . Diabetes mellitus without complication ([place])   . Family history of adverse reaction to anesthesia     " My mother always had a problem waking up. "  . GERD (gastroesophageal reflux disease)   . Headache   . History of kidney stones   . Hypertension   . Hypothyroidism   . Synovial cyst    lumbar  . Wears glasses    Past Surgical History:  Procedure Laterality Date  . APPENDECTOMY    . BREAST SURGERY  1994  . CATARACT EXTRACTION W/ INTRAOCULAR LENS  IMPLANT, BILATERAL    . CHOLECYSTECTOMY  1984  . COLONOSCOPY WITH ESOPHAGOGASTRODUODENOSCOPY (EGD)    . CYSTOSCOPY W/ STONE MANIPULATION  1990s  . EYE SURGERY    . LUMBAR LAMINECTOMY/DECOMPRESSION MICRODISCECTOMY Left 03/02/2018   Procedure: Microdiscectomy- extraforaminal, synovial cyst removal - Lumbar Four-Lumbar Five - left;  Surgeon:  Kary Kos, MD;  Location: [place];  Service: Neurosurgery;  Laterality: Left;  Microdiscectomy- extraforaminal, synovial cyst removal - Lumbar Four-Lumbar Five - left   Allergies  Allergen Reactions  . Bee Venom Itching and Swelling  . Macrodantin [Nitrofurantoin Macrocrystal] Nausea And Vomiting and Other (See Comments)    Also causes chest pain  . Codeine Nausea And Vomiting   Prior to Admission medications   Medication Sig Start Date End Date Taking? Authorizing Provider  acetaminophen (TYLENOL) 500 MG tablet Take 500 mg by mouth every 6 (six) hours as needed for moderate pain.   Yes [provider]  atorvastatin (LIPITOR) 20 MG tablet Take 20 mg by mouth daily.    Yes [provider]  glimepiride (AMARYL) 2 MG tablet Take 2 mg by mouth daily with breakfast. 04/29/20  Yes [provider]  levothyroxine (SYNTHROID, LEVOTHROID) 50 MCG tablet Take 50 mcg by mouth every other day.   Yes [provider]  losartan-hydrochlorothiazide (HYZAAR) 100-25 MG tablet Take 1 tablet by mouth daily.   Yes [provider]   Social History   Socioeconomic History  . Marital status: Widowed    Spouse name: Not on file  . Number of children: Not on file  . Years of education: Not on file  . Highest education level: Not on file  Occupational History  . Not on file  Tobacco Use  . Smoking status: Former Smoker    Packs/day: 0.25    Years:  5.00    Pack years: 1.25    Types: Cigarettes    Quit date: 67    Years since quitting: 37.0  . Smokeless tobacco: Never Used  . Tobacco comment: quit  smoking in 30's  Vaping Use  . Vaping Use: Never used  Substance and Sexual Activity  . Alcohol use: Yes    Comment: occasional  . Drug use: No  . Sexual activity: Not on file  Other Topics Concern  . Not on file  Social History Narrative  . Not on file   Social Determinants of Health   Financial Resource Strain: Not on file  Food Insecurity: Not on file   Transportation Needs: Not on file  Physical Activity: Not on file  Stress: Not on file  Social Connections: Not on file   Family History  Problem Relation Age of Onset  . Colon cancer Mother   . Prostate cancer Father     ROS: Currently denies lightheadedness, dizziness, Fever, chills, CP, SOB.   No personal history of DVT, PE, MI, or CVA. No loose teeth or dentures All other systems have been reviewed and were otherwise currently negative with the exception of those mentioned in the HPI and as above.  Objective: Vitals: Ht: 5'4" Wt: 196.2 lbs Temp: 97.8 BP: 168/90 Pulse: 93 O2 97% on room air.   Physical Exam: General: Alert, NAD.  Antalgic Gait  HEENT: EOMI, Good Neck Extension  Pulm: No increased work of breathing.  Clear B/L A/P w/o crackle or wheeze.  CV: RRR, No m/g/r appreciated  GI: soft, NT, ND Neuro: Neuro without gross focal deficit.  Sensation intact distally Skin: No lesions in the area of chief complaint MSK/Surgical Site: knee w/o redness or effusion.  + crepitus. No medial or lateral JLT. ROM 0-100.  5/5 strength in extension and flexion.  +EHL/FHL.  NVI.  Stable varus and valgus stress.    Imaging Review Plain radiographs demonstrate severe degenerative joint disease of the right knee.   The overall alignment ismild valgus. The bone quality appears to be adequate for age and reported activity level.  Preoperative templating of the joint replacement has been completed, documented, and submitted to the Operating Room personnel in order to optimize intra-operative equipment management.  Assessment: djd right knee  Plan: Plan for Procedure(s): TOTAL KNEE ARTHROPLASTY  The patient history, physical exam, clinical judgement of the provider and imaging are consistent with end stage degenerative joint disease and joint arthroplasty is deemed medically necessary. The treatment options including medical management, injection therapy, and arthroplasty were discussed  at length. The risks and benefits of Procedure(s): TOTAL KNEE ARTHROPLASTY were presented and reviewed.  The risks of nonoperative treatment, versus surgical intervention including but not limited to continued pain, aseptic loosening, stiffness, dislocation/subluxation, infection, bleeding, nerve injury, blood clots, cardiopulmonary complications, morbidity, mortality, among others were discussed. The patient verbalizes understanding and wishes to proceed with the plan.  Patient is being admitted for inpatient treatment for surgery, pain control, PT, prophylactic antibiotics, VTE prophylaxis, progressive ambulation, ADL's and discharge planning.   Dental prophylaxis discussed and recommended for 2 years postoperatively.   The patient does meet the criteria for TXA which will be used perioperatively.    ASA 325 mg  will be used postoperatively for DVT prophylaxis in addition to SCDs, and early ambulation.  The patient is planning to be discharged home with OPPT  in care of her daughters       Jola Baptist 07/31/2020 9:44 AM

## 2020-08-03 NOTE — Anesthesia Preprocedure Evaluation (Signed)
Anesthesia Evaluation  Patient identified by MRN, date of birth, ID band Patient awake    Reviewed: Allergy & Precautions, NPO status , Patient's Chart, lab work & pertinent test results  Airway Mallampati: II  TM Distance: >3 FB Neck ROM: Full    Dental no notable dental hx. (+) Caps, Dental Advisory Given, Poor Dentition,    Pulmonary former smoker,    Pulmonary exam normal breath sounds clear to auscultation       Cardiovascular hypertension, Pt. on medications Normal cardiovascular exam Rhythm:Regular Rate:Normal     Neuro/Psych negative psych ROS   GI/Hepatic Neg liver ROS, GERD  ,  Endo/Other  diabetesHypothyroidism   Renal/GU K+ 4.5 Cr 1.36  negative genitourinary   Musculoskeletal  (+) Arthritis ,   Abdominal (+) + obese,   Peds  Hematology negative hematology ROS (+) Hgb 14.7  Plt 220   Anesthesia Other Findings All : bee venom, Macrodantin, Codeine  Reproductive/Obstetrics                            Anesthesia Physical Anesthesia Plan  ASA: III  Anesthesia Plan: General   Post-op Pain Management:  Regional for Post-op pain   Induction: Intravenous  PONV Risk Score and Plan: 3 and Treatment may vary due to age or medical condition and Ondansetron  Airway Management Planned: Oral ETT  Additional Equipment: None  Intra-op Plan:   Post-operative Plan: Extubation in OR  Informed Consent: I have reviewed the patients History and Physical, chart, labs and discussed the procedure including the risks, benefits and alternatives for the proposed anesthesia with the patient or authorized representative who has indicated his/her understanding and acceptance.     Dental advisory given  Plan Discussed with: CRNA  Anesthesia Plan Comments: (Sp w R Adductor canal block  3 attempts at spinal unsuccessful. Switch to GA smooth induction)      Anesthesia Quick Evaluation

## 2020-08-04 ENCOUNTER — Ambulatory Visit (HOSPITAL_COMMUNITY): Payer: Medicare PPO

## 2020-08-04 ENCOUNTER — Ambulatory Visit (HOSPITAL_COMMUNITY): Payer: Medicare PPO | Admitting: Certified Registered Nurse Anesthetist

## 2020-08-04 ENCOUNTER — Ambulatory Visit (HOSPITAL_COMMUNITY)
Admission: RE | Admit: 2020-08-04 | Discharge: 2020-08-04 | Disposition: A | Discharge status: Home / Self Care | Payer: Medicare PPO | Attending: Orthopedic Surgery | Admitting: Orthopedic Surgery

## 2020-08-04 ENCOUNTER — Encounter (HOSPITAL_COMMUNITY): Payer: Self-pay | Admitting: Orthopedic Surgery

## 2020-08-04 ENCOUNTER — Encounter (HOSPITAL_COMMUNITY)
Admission: RE | Disposition: A | Discharge status: Home / Self Care | Payer: Self-pay | Source: Home / Self Care | Attending: Orthopedic Surgery

## 2020-08-04 DIAGNOSIS — Z7984 Long term (current) use of oral hypoglycemic drugs: Secondary | ICD-10-CM | POA: Insufficient documentation

## 2020-08-04 DIAGNOSIS — Z79890 Hormone replacement therapy (postmenopausal): Secondary | ICD-10-CM | POA: Diagnosis not present

## 2020-08-04 DIAGNOSIS — M1711 Unilateral primary osteoarthritis, right knee: Secondary | ICD-10-CM | POA: Insufficient documentation

## 2020-08-04 DIAGNOSIS — Z79899 Other long term (current) drug therapy: Secondary | ICD-10-CM | POA: Diagnosis not present

## 2020-08-04 DIAGNOSIS — Z87891 Personal history of nicotine dependence: Secondary | ICD-10-CM | POA: Diagnosis not present

## 2020-08-04 DIAGNOSIS — Z96651 Presence of right artificial knee joint: Secondary | ICD-10-CM

## 2020-08-04 HISTORY — PX: TOTAL KNEE ARTHROPLASTY: SHX125

## 2020-08-04 LAB — GLUCOSE, CAPILLARY
Glucose-Capillary: 118 mg/dL — ABNORMAL HIGH (ref 70–99)
Glucose-Capillary: 139 mg/dL — ABNORMAL HIGH (ref 70–99)

## 2020-08-04 SURGERY — ARTHROPLASTY, KNEE, TOTAL
Anesthesia: Spinal | Site: Knee | Laterality: Right

## 2020-08-04 MED ORDER — FENTANYL CITRATE (PF) 100 MCG/2ML IJ SOLN
25.0000 ug | INTRAMUSCULAR | Status: DC | PRN
  Administered 2020-08-04 (×2): 50 ug via INTRAVENOUS

## 2020-08-04 MED ORDER — ONDANSETRON HCL 4 MG/2ML IJ SOLN
INTRAMUSCULAR | Status: AC
  Filled 2020-08-04: qty 2

## 2020-08-04 MED ORDER — MIDAZOLAM HCL 2 MG/2ML IJ SOLN
1.0000 mg | INTRAMUSCULAR | Status: AC
  Administered 2020-08-04: 1 mg via INTRAVENOUS
  Filled 2020-08-04: qty 2

## 2020-08-04 MED ORDER — DEXAMETHASONE SODIUM PHOSPHATE 10 MG/ML IJ SOLN
INTRAMUSCULAR | Status: AC
  Filled 2020-08-04: qty 1

## 2020-08-04 MED ORDER — ORAL CARE MOUTH RINSE: 15.0000 mL | Freq: Once | OROMUCOSAL | Status: AC

## 2020-08-04 MED ORDER — EPHEDRINE SULFATE-NACL 50-0.9 MG/10ML-% IV SOSY
PREFILLED_SYRINGE | INTRAVENOUS | Status: DC | PRN
  Administered 2020-08-04 (×3): 10 mg via INTRAVENOUS

## 2020-08-04 MED ORDER — 0.9 % SODIUM CHLORIDE (POUR BTL) OPTIME
TOPICAL | Status: DC | PRN
  Administered 2020-08-04: 1000 mL

## 2020-08-04 MED ORDER — ASPIRIN EC 325 MG PO TBEC: 325.0000 mg | DELAYED_RELEASE_TABLET | Freq: Two times a day (BID) | ORAL | 0 refills | Status: DC

## 2020-08-04 MED ORDER — TRANEXAMIC ACID-NACL 1000-0.7 MG/100ML-% IV SOLN
1000.0000 mg | INTRAVENOUS | Status: AC
  Administered 2020-08-04: 1000 mg via INTRAVENOUS
  Filled 2020-08-04: qty 100

## 2020-08-04 MED ORDER — CHLORHEXIDINE GLUCONATE 0.12 % MT SOLN
15.0000 mL | Freq: Once | OROMUCOSAL | Status: AC
  Administered 2020-08-04: 15 mL via OROMUCOSAL

## 2020-08-04 MED ORDER — DEXMEDETOMIDINE (PRECEDEX) IN NS 20 MCG/5ML (4 MCG/ML) IV SYRINGE
PREFILLED_SYRINGE | INTRAVENOUS | Status: AC
  Filled 2020-08-04: qty 10

## 2020-08-04 MED ORDER — KETOROLAC TROMETHAMINE 30 MG/ML IJ SOLN
INTRAMUSCULAR | Status: DC | PRN
  Administered 2020-08-04: 30 mg

## 2020-08-04 MED ORDER — KETOROLAC TROMETHAMINE 30 MG/ML IJ SOLN
INTRAMUSCULAR | Status: AC
  Filled 2020-08-04: qty 1

## 2020-08-04 MED ORDER — WATER FOR IRRIGATION, STERILE IR SOLN
Status: DC | PRN
  Administered 2020-08-04: 2000 mL

## 2020-08-04 MED ORDER — ACETAMINOPHEN 10 MG/ML IV SOLN: 1000.0000 mg | Freq: Once | INTRAVENOUS | Status: DC | PRN

## 2020-08-04 MED ORDER — ROPIVACAINE HCL 7.5 MG/ML IJ SOLN
INTRAMUSCULAR | Status: DC | PRN
  Administered 2020-08-04: 20 mL via PERINEURAL

## 2020-08-04 MED ORDER — CEFAZOLIN SODIUM-DEXTROSE 2-4 GM/100ML-% IV SOLN
2.0000 g | INTRAVENOUS | Status: AC
  Administered 2020-08-04: 2 g via INTRAVENOUS
  Filled 2020-08-04: qty 100

## 2020-08-04 MED ORDER — AMISULPRIDE (ANTIEMETIC) 5 MG/2ML IV SOLN
INTRAVENOUS | Status: AC
  Filled 2020-08-04: qty 4

## 2020-08-04 MED ORDER — PROPOFOL 10 MG/ML IV BOLUS
INTRAVENOUS | Status: DC | PRN
  Administered 2020-08-04: 120 mg via INTRAVENOUS
  Administered 2020-08-04: 30 mg via INTRAVENOUS
  Administered 2020-08-04: 40 mg via INTRAVENOUS

## 2020-08-04 MED ORDER — ONDANSETRON HCL 4 MG/2ML IJ SOLN
INTRAMUSCULAR | Status: DC | PRN
  Administered 2020-08-04: 4 mg via INTRAVENOUS

## 2020-08-04 MED ORDER — PROPOFOL 1000 MG/100ML IV EMUL
INTRAVENOUS | Status: AC
  Filled 2020-08-04: qty 100

## 2020-08-04 MED ORDER — SUGAMMADEX SODIUM 200 MG/2ML IV SOLN
INTRAVENOUS | Status: DC | PRN
  Administered 2020-08-04: 200 mg via INTRAVENOUS

## 2020-08-04 MED ORDER — ASPIRIN 81 MG PO TBEC: 81.0000 mg | DELAYED_RELEASE_TABLET | Freq: Two times a day (BID) | ORAL | 0 refills | Status: AC

## 2020-08-04 MED ORDER — LACTATED RINGERS IV BOLUS: 250.0000 mL | Freq: Once | INTRAVENOUS | Status: DC

## 2020-08-04 MED ORDER — ONDANSETRON HCL 4 MG/2ML IJ SOLN
4.0000 mg | Freq: Once | INTRAMUSCULAR | Status: AC | PRN
  Administered 2020-08-04: 4 mg via INTRAVENOUS

## 2020-08-04 MED ORDER — LACTATED RINGERS IV SOLN: INTRAVENOUS | Status: DC

## 2020-08-04 MED ORDER — POVIDONE-IODINE 10 % EX SWAB
2.0000 "application " | Freq: Once | CUTANEOUS | Status: AC
  Administered 2020-08-04: 2 via TOPICAL

## 2020-08-04 MED ORDER — BUPIVACAINE HCL 0.25 % IJ SOLN
INTRAMUSCULAR | Status: DC | PRN
  Administered 2020-08-04: 30 mL

## 2020-08-04 MED ORDER — ROCURONIUM BROMIDE 100 MG/10ML IV SOLN
INTRAVENOUS | Status: DC | PRN
  Administered 2020-08-04: 40 mg via INTRAVENOUS

## 2020-08-04 MED ORDER — SODIUM CHLORIDE 0.9 % IR SOLN
Status: DC | PRN
  Administered 2020-08-04: 1000 mL

## 2020-08-04 MED ORDER — DEXAMETHASONE SODIUM PHOSPHATE 10 MG/ML IJ SOLN
INTRAMUSCULAR | Status: DC | PRN
  Administered 2020-08-04: 6 mg via INTRAVENOUS

## 2020-08-04 MED ORDER — FENTANYL CITRATE (PF) 100 MCG/2ML IJ SOLN
INTRAMUSCULAR | Status: DC | PRN
  Administered 2020-08-04: 25 ug via INTRAVENOUS
  Administered 2020-08-04: 50 ug via INTRAVENOUS
  Administered 2020-08-04: 100 ug via INTRAVENOUS
  Administered 2020-08-04: 25 ug via INTRAVENOUS
  Administered 2020-08-04: 50 ug via INTRAVENOUS
  Administered 2020-08-04 (×2): 25 ug via INTRAVENOUS
  Administered 2020-08-04 (×2): 50 ug via INTRAVENOUS

## 2020-08-04 MED ORDER — LACTATED RINGERS IV BOLUS
500.0000 mL | Freq: Once | INTRAVENOUS | Status: AC
  Administered 2020-08-04: 500 mL via INTRAVENOUS

## 2020-08-04 MED ORDER — FENTANYL CITRATE (PF) 100 MCG/2ML IJ SOLN
INTRAMUSCULAR | Status: AC
  Filled 2020-08-04: qty 2

## 2020-08-04 MED ORDER — TRANEXAMIC ACID-NACL 1000-0.7 MG/100ML-% IV SOLN
1000.0000 mg | Freq: Once | INTRAVENOUS | Status: AC
  Administered 2020-08-04: 1000 mg via INTRAVENOUS

## 2020-08-04 MED ORDER — ACETAMINOPHEN 500 MG PO TABS
1000.0000 mg | ORAL_TABLET | Freq: Once | ORAL | Status: AC
  Administered 2020-08-04: 1000 mg via ORAL
  Filled 2020-08-04: qty 2

## 2020-08-04 MED ORDER — HYDROCODONE-ACETAMINOPHEN 7.5-325 MG/15ML PO SOLN: 15.0000 mL | ORAL | 0 refills | Status: AC | PRN

## 2020-08-04 MED ORDER — SENNA-DOCUSATE SODIUM 8.6-50 MG PO TABS: 2.0000 | ORAL_TABLET | Freq: Every day | ORAL | 1 refills | Status: AC

## 2020-08-04 MED ORDER — BUPIVACAINE HCL 0.25 % IJ SOLN
INTRAMUSCULAR | Status: AC
  Filled 2020-08-04: qty 1

## 2020-08-04 MED ORDER — TRANEXAMIC ACID-NACL 1000-0.7 MG/100ML-% IV SOLN
INTRAVENOUS | Status: AC
  Filled 2020-08-04: qty 100

## 2020-08-04 MED ORDER — LIDOCAINE 2% (20 MG/ML) 5 ML SYRINGE
INTRAMUSCULAR | Status: DC | PRN
  Administered 2020-08-04: 100 mg via INTRAVENOUS

## 2020-08-04 MED ORDER — CEFAZOLIN SODIUM-DEXTROSE 2-4 GM/100ML-% IV SOLN: 2.0000 g | Freq: Four times a day (QID) | INTRAVENOUS | Status: DC

## 2020-08-04 MED ORDER — DEXMEDETOMIDINE HCL 200 MCG/2ML IV SOLN
INTRAVENOUS | Status: DC | PRN
  Administered 2020-08-04 (×2): 4 ug via INTRAVENOUS
  Administered 2020-08-04: 8 ug via INTRAVENOUS

## 2020-08-04 MED ORDER — AMISULPRIDE (ANTIEMETIC) 5 MG/2ML IV SOLN
10.0000 mg | Freq: Once | INTRAVENOUS | Status: AC
  Administered 2020-08-04: 10 mg via INTRAVENOUS

## 2020-08-04 MED ORDER — ONDANSETRON HCL 4 MG PO TABS: 4.0000 mg | ORAL_TABLET | Freq: Three times a day (TID) | ORAL | 0 refills | Status: AC | PRN

## 2020-08-04 MED ORDER — PHENYLEPHRINE HCL (PRESSORS) 10 MG/ML IV SOLN
INTRAVENOUS | Status: AC
  Filled 2020-08-04: qty 1

## 2020-08-04 MED ORDER — PROPOFOL 10 MG/ML IV BOLUS
INTRAVENOUS | Status: AC
  Filled 2020-08-04: qty 40

## 2020-08-04 MED ORDER — FENTANYL CITRATE (PF) 100 MCG/2ML IJ SOLN
50.0000 ug | INTRAMUSCULAR | Status: AC
  Administered 2020-08-04: 50 ug via INTRAVENOUS
  Filled 2020-08-04: qty 2

## 2020-08-04 SURGICAL SUPPLY — 60 items
ATTUNE PSFEM RTSZ5 NARCEM KNEE (Femur) ×1 IMPLANT
BAG SPEC THK2 15X12 ZIP CLS (MISCELLANEOUS)
BAG ZIPLOCK 12X15 (MISCELLANEOUS) IMPLANT
BASEPLATE TIB CMT FB PCKT SZ4 (Stem) ×1 IMPLANT
BLADE SAG 18X100X1.27 (BLADE) ×2 IMPLANT
BLADE SAW SGTL 13X75X1.27 (BLADE) ×2 IMPLANT
BLADE SURG 15 STRL LF DISP TIS (BLADE) ×1 IMPLANT
BLADE SURG 15 STRL SS (BLADE) ×2
BNDG CMPR MED 10X6 ELC LF (GAUZE/BANDAGES/DRESSINGS) ×1
BNDG ELASTIC 4X5.8 VLCR STR LF (GAUZE/BANDAGES/DRESSINGS) ×1 IMPLANT
BNDG ELASTIC 6X10 VLCR STRL LF (GAUZE/BANDAGES/DRESSINGS) ×2 IMPLANT
BOWL SMART MIX CTS (DISPOSABLE) ×2 IMPLANT
BSPLAT TIB 4 CMNT FX BRNG STRL (Stem) ×1 IMPLANT
CEMENT HV SMART SET (Cement) ×4 IMPLANT
CLSR STERI-STRIP ANTIMIC 1/2X4 (GAUZE/BANDAGES/DRESSINGS) ×4 IMPLANT
COVER SURGICAL LIGHT HANDLE (MISCELLANEOUS) ×2 IMPLANT
COVER WAND RF STERILE (DRAPES) IMPLANT
CUFF TOURN SGL QUICK 34 (TOURNIQUET CUFF) ×2
CUFF TRNQT CYL 34X4.125X (TOURNIQUET CUFF) ×1 IMPLANT
DECANTER SPIKE VIAL GLASS SM (MISCELLANEOUS) IMPLANT
DRAPE ORTHO SPLIT 77X108 STRL (DRAPES) ×2
DRAPE SURG ORHT 6 SPLT 77X108 (DRAPES) ×1 IMPLANT
DRAPE U-SHAPE 47X51 STRL (DRAPES) ×2 IMPLANT
DRSG MEPILEX BORDER 4X12 (GAUZE/BANDAGES/DRESSINGS) ×2 IMPLANT
DRSG PAD ABDOMINAL 8X10 ST (GAUZE/BANDAGES/DRESSINGS) ×5 IMPLANT
DURAPREP 26ML APPLICATOR (WOUND CARE) ×4 IMPLANT
ELECT REM PT RETURN 15FT ADLT (MISCELLANEOUS) ×2 IMPLANT
GLOVE BIO SURGEON STRL SZ7.5 (GLOVE) ×2 IMPLANT
GLOVE SRG 8 PF TXTR STRL LF DI (GLOVE) ×1 IMPLANT
GLOVE SURG ENC MOIS LTX SZ6.5 (GLOVE) ×2 IMPLANT
GLOVE SURG UNDER POLY LF SZ7 (GLOVE) ×2 IMPLANT
GLOVE SURG UNDER POLY LF SZ8 (GLOVE) ×2
GOWN STRL REUS W/ TWL LRG LVL3 (GOWN DISPOSABLE) ×2 IMPLANT
GOWN STRL REUS W/TWL LRG LVL3 (GOWN DISPOSABLE) ×4
HANDPIECE INTERPULSE COAX TIP (DISPOSABLE) ×2
HOLDER FOLEY CATH W/STRAP (MISCELLANEOUS) IMPLANT
HOOD PEEL AWAY FLYTE STAYCOOL (MISCELLANEOUS) ×6 IMPLANT
IMMOBILIZER KNEE 20 (SOFTGOODS) ×2
IMMOBILIZER KNEE 20 THIGH 36 (SOFTGOODS) ×1 IMPLANT
INSERT TIB FIX BEARNG SZ 5 5MM (Insert) ×1 IMPLANT
KIT TURNOVER KIT A (KITS) IMPLANT
MANIFOLD NEPTUNE II (INSTRUMENTS) ×2 IMPLANT
NS IRRIG 1000ML POUR BTL (IV SOLUTION) ×2 IMPLANT
PACK ICE MAXI GEL EZY WRAP (MISCELLANEOUS) ×2 IMPLANT
PACK TOTAL KNEE CUSTOM (KITS) ×2 IMPLANT
PATELLA MEDIAL ATTUN 35MM KNEE (Knees) ×1 IMPLANT
PENCIL SMOKE EVACUATOR (MISCELLANEOUS) ×2 IMPLANT
PIN DRILL FIX HALF THREAD (BIT) ×1 IMPLANT
PIN STEINMAN FIXATION KNEE (PIN) ×1 IMPLANT
PROTECTOR NERVE ULNAR (MISCELLANEOUS) ×2 IMPLANT
SET HNDPC FAN SPRY TIP SCT (DISPOSABLE) ×1 IMPLANT
SET PAD KNEE POSITIONER (MISCELLANEOUS) ×2 IMPLANT
STRIP CLOSURE SKIN 1/2X4 (GAUZE/BANDAGES/DRESSINGS) ×1 IMPLANT
SUT VIC AB 1 CT1 36 (SUTURE) ×4 IMPLANT
SUT VIC AB 2-0 CT1 27 (SUTURE) ×2
SUT VIC AB 2-0 CT1 TAPERPNT 27 (SUTURE) ×1 IMPLANT
SUT VIC AB 3-0 SH 8-18 (SUTURE) ×2 IMPLANT
TRAY FOLEY MTR SLVR 16FR STAT (SET/KITS/TRAYS/PACK) ×2 IMPLANT
WATER STERILE IRR 1000ML POUR (IV SOLUTION) ×4 IMPLANT
WRAP KNEE MAXI GEL POST OP (GAUZE/BANDAGES/DRESSINGS) ×1 IMPLANT

## 2020-08-04 NOTE — Progress Notes (Signed)
AssistedDr. Houser with right, ultrasound guided, adductor canal block. Side rails up, monitors on throughout procedure. See vital signs in flow sheet. Tolerated Procedure well.  

## 2020-08-04 NOTE — Transfer of Care (Signed)
Immediate Anesthesia Transfer of Care Note  Patient: Linda Frederick  Procedure(s) Performed: TOTAL KNEE ARTHROPLASTY (Right Knee)  Patient Location: PACU  Anesthesia Type:GA combined with regional for post-op pain  Level of Consciousness: awake, alert , oriented and patient cooperative  Airway & Oxygen Therapy: Patient Spontanous Breathing and Patient connected to face mask oxygen  Post-op Assessment: Report given to RN and Post -op Vital signs reviewed and stable  Post vital signs: Reviewed and stable  Last Vitals:  Vitals Value Taken Time  BP 169/89 08/04/20 1426  Temp    Pulse 86 08/04/20 1428  Resp 21 08/04/20 1428  SpO2 100 % 08/04/20 1428  Vitals shown include unvalidated device data.  Last Pain:  Vitals:   08/04/20 0857  PainSc: 0-No pain         Complications: No complications documented.

## 2020-08-04 NOTE — Interval H&P Note (Signed)
History and Physical Interval Note:  08/04/2020 10:00 AM  Linda Frederick  has presented today for surgery, with the diagnosis of djd right knee.  The various methods of treatment have been discussed with the patient and family. After consideration of risks, benefits and other options for treatment, the patient has consented to  Procedure(s): TOTAL KNEE ARTHROPLASTY (Right) as a surgical intervention.  The patient's history has been reviewed, patient examined, no change in status, stable for surgery.  I have reviewed the patient's chart and labs.  Questions were answered to the patient's satisfaction.     Johnny Bridge

## 2020-08-04 NOTE — Discharge Instructions (Signed)
INSTRUCTIONS AFTER JOINT REPLACEMENT  ° °o Remove items at home which could result in a fall. This includes throw rugs or furniture in walking pathways °o ICE to the affected joint every three hours while awake for 30 minutes at a time, for at least the first 3-5 days, and then as needed for pain and swelling.  Continue to use ice for pain and swelling. You may notice swelling that will progress down to the foot and ankle.  This is normal after surgery.  Elevate your leg when you are not up walking on it.   °o Continue to use the breathing machine you got in the hospital (incentive spirometer) which will help keep your temperature down.  It is common for your temperature to cycle up and down following surgery, especially at night when you are not up moving around and exerting yourself.  The breathing machine keeps your lungs expanded and your temperature down. ° ° °DIET:  As you were doing prior to hospitalization, we recommend a well-balanced diet. ° °DRESSING / WOUND CARE / SHOWERING ° °You may change your dressing 3-5 days after surgery.  Then change the dressing every day with sterile gauze.  Please use good hand washing techniques before changing the dressing.  Do not use any lotions or creams on the incision until instructed by your surgeon. ° °ACTIVITY ° °o Increase activity slowly as tolerated, but follow the weight bearing instructions below.   °o No driving for 6 weeks or until further direction given by your physician.  You cannot drive while taking narcotics.  °o No lifting or carrying greater than 10 lbs. until further directed by your surgeon. °o Avoid periods of inactivity such as sitting longer than an hour when not asleep. This helps prevent blood clots.  °o You may return to work once you are authorized by your doctor.  ° ° ° °WEIGHT BEARING  ° °Weight bearing as tolerated with assist device (walker, cane, etc) as directed, use it as long as suggested by your surgeon or therapist, typically at  least 4-6 weeks. ° ° °EXERCISES ° °Results after joint replacement surgery are often greatly improved when you follow the exercise, range of motion and muscle strengthening exercises prescribed by your doctor. Safety measures are also important to protect the joint from further injury. Any time any of these exercises cause you to have increased pain or swelling, decrease what you are doing until you are comfortable again and then slowly increase them. If you have problems or questions, call your caregiver or physical therapist for advice.  ° °Rehabilitation is important following a joint replacement. After just a few days of immobilization, the muscles of the leg can become weakened and shrink (atrophy).  These exercises are designed to build up the tone and strength of the thigh and leg muscles and to improve motion. Often times heat used for twenty to thirty minutes before working out will loosen up your tissues and help with improving the range of motion but do not use heat for the first two weeks following surgery (sometimes heat can increase post-operative swelling).  ° °These exercises can be done on a training (exercise) mat, on the floor, on a table or on a bed. Use whatever works the best and is most comfortable for you.    Use music or television while you are exercising so that the exercises are a pleasant break in your day. This will make your life better with the exercises acting as a break   in your routine that you can look forward to.   Perform all exercises about fifteen times, three times per day or as directed.  You should exercise both the operative leg and the other leg as well. ° °Exercises include: °  °• Quad Sets - Tighten up the muscle on the front of the thigh (Quad) and hold for 5-10 seconds.   °• Straight Leg Raises - With your knee straight (if you were given a brace, keep it on), lift the leg to 60 degrees, hold for 3 seconds, and slowly lower the leg.  Perform this exercise against  resistance later as your leg gets stronger.  °• Leg Slides: Lying on your back, slowly slide your foot toward your buttocks, bending your knee up off the floor (only go as far as is comfortable). Then slowly slide your foot back down until your leg is flat on the floor again.  °• Angel Wings: Lying on your back spread your legs to the side as far apart as you can without causing discomfort.  °• Hamstring Strength:  Lying on your back, push your heel against the floor with your leg straight by tightening up the muscles of your buttocks.  Repeat, but this time bend your knee to a comfortable angle, and push your heel against the floor.  You may put a pillow under the heel to make it more comfortable if necessary.  ° °A rehabilitation program following joint replacement surgery can speed recovery and prevent re-injury in the future due to weakened muscles. Contact your doctor or a physical therapist for more information on knee rehabilitation.  ° ° °CONSTIPATION ° °Constipation is defined medically as fewer than three stools per week and severe constipation as less than one stool per week.  Even if you have a regular bowel pattern at home, your normal regimen is likely to be disrupted due to multiple reasons following surgery.  Combination of anesthesia, postoperative narcotics, change in appetite and fluid intake all can affect your bowels.  ° °YOU MUST use at least one of the following options; they are listed in order of increasing strength to get the job done.  They are all available over the counter, and you may need to use some, POSSIBLY even all of these options:   ° °Drink plenty of fluids (prune juice may be helpful) and high fiber foods °Colace 100 mg by mouth twice a day  °Senokot for constipation as directed and as needed Dulcolax (bisacodyl), take with full glass of water  °Miralax (polyethylene glycol) once or twice a day as needed. ° °If you have tried all these things and are unable to have a bowel  movement in the first 3-4 days after surgery call either your surgeon or your primary doctor.   ° °If you experience loose stools or diarrhea, hold the medications until you stool forms back up.  If your symptoms do not get better within 1 week or if they get worse, check with your doctor.  If you experience "the worst abdominal pain ever" or develop nausea or vomiting, please contact the office immediately for further recommendations for treatment. ° ° °ITCHING:  If you experience itching with your medications, try taking only a single pain pill, or even half a pain pill at a time.  You can also use Benadryl over the counter for itching or also to help with sleep.  ° °TED HOSE STOCKINGS:  Use stockings on both legs until for at least 2 weeks or as   directed by physician office. They may be removed at night for sleeping. ° °MEDICATIONS:  See your medication summary on the “After Visit Summary” that nursing will review with you.  You may have some home medications which will be placed on hold until you complete the course of blood thinner medication.  It is important for you to complete the blood thinner medication as prescribed. ° °PRECAUTIONS:  If you experience chest pain or shortness of breath - call 911 immediately for transfer to the hospital emergency department.  ° °If you develop a fever greater that 101 F, purulent drainage from wound, increased redness or drainage from wound, foul odor from the wound/dressing, or calf pain - CONTACT YOUR SURGEON.   °                                                °FOLLOW-UP APPOINTMENTS:  If you do not already have a post-op appointment, please call the office for an appointment to be seen by your surgeon.  Guidelines for how soon to be seen are listed in your “After Visit Summary”, but are typically between 1-4 weeks after surgery. ° °OTHER INSTRUCTIONS:  ° °Knee Replacement:  Do not place pillow under knee, focus on keeping the knee straight while resting.  ° °DENTAL  ANTIBIOTICS: ° °In most cases prophylactic antibiotics for Dental procdeures after total joint surgery are not necessary. ° °Exceptions are as follows: ° °1. History of prior total joint infection ° °2. Severely immunocompromised (Organ Transplant, cancer chemotherapy, Rheumatoid biologic °meds such as Humera) ° °3. Poorly controlled diabetes (A1C &gt; 8.0, blood glucose over 200) ° °If you have one of these conditions, contact your surgeon for an antibiotic prescription, prior to your °dental procedure. ° ° °MAKE SURE YOU:  °• Understand these instructions.  °• Get help right away if you are not doing well or get worse.  ° ° °Thank you for letting us be a part of your medical care team.  It is a privilege we respect greatly.  We hope these instructions will help you stay on track for a fast and full recovery!  ° °

## 2020-08-04 NOTE — Anesthesia Procedure Notes (Addendum)
Procedure Name: Intubation Date/Time: 08/04/2020 11:45 AM Performed by: [place], CRNA Pre-anesthesia Checklist: Patient identified, Emergency Drugs available, Suction available, Patient being monitored and Timeout performed Patient Re-evaluated:Patient Re-evaluated prior to induction Oxygen Delivery Method: Circle system utilized Preoxygenation: Pre-oxygenation with 100% oxygen Induction Type: IV induction Ventilation: Mask ventilation without difficulty Laryngoscope Size: Mac and 3 Grade View: Grade I Tube type: Oral Tube size: 7.0 mm Number of attempts: 1 Airway Equipment and Method: Stylet Placement Confirmation: ETT inserted through vocal cords under direct vision,  positive ETCO2 and breath sounds checked- equal and bilateral Secured at: 21 cm Tube secured with: Tape Dental Injury: Teeth and Oropharynx as per pre-operative assessment  Comments: AOI

## 2020-08-04 NOTE — Anesthesia Procedure Notes (Addendum)
Anesthesia Regional Block: Adductor canal block   Pre-Anesthetic Checklist: ,, timeout performed, Correct Patient, Correct Site, Correct Laterality, Correct Procedure, Correct Position, site marked, Risks and benefits discussed,  Surgical consent,  Pre-op evaluation,  At surgeon's request and post-op pain management  Laterality: Right and Lower  Prep: chloraprep       Needles:  Injection technique: Single-shot  Needle Type: Echogenic Needle     Needle Length: 9cm  Needle Gauge: 22     Additional Needles:   Procedures:,,,, ultrasound used (permanent image in chart),,,,  Narrative:  Start time: 08/04/2020 9:50 AM End time: 08/04/2020 10:00 AM Injection made incrementally with aspirations every 5 mL.  Performed by: Personally  Anesthesiologist: Barnet Glasgow, MD  Additional Notes: Block assessed prior to surgery. Pt tolerated procedure well.

## 2020-08-04 NOTE — Evaluation (Signed)
Physical Therapy Evaluation Patient Details Name: Linda Frederick MRN: [id number] DOB: November 05, 1940 Today's Date: 08/04/2020   History of Present Illness  Patient is 80 y.o. female s/p Rt TKR on 08/04/20 with PMH significant for HTN, hypothyroidism, GERD, DM, anemia, OA, back surgery.  Clinical Impression  Pt is a 80yo female s/p R TKA POD 0. Pt reports that she is independent with mobility at baseline. Pt required MIN guard for safety and verbal cues for sit to stand transfers from EOB and BSC. Pt displayed slightly increased knee flexion in midstance with increased WB though UEs on RW during ambulation. Pt required MIN assist progressing to MIN guard for ambulation 41' with verbal cues for RW management and step to gait pattern with no LOB observed. Pt was able to safely perform stair negotiation with MIN assist for safety and RW management and cues for sequencing, pt was able to verbalize safe guarding position for family members when assisting at home.  Pt's daughters will be staying with her while she recovers. Pt is at a safe mobility level for discharge home. Recommend home with family support. Pt will benefit from skilled PT to increase independence and safety with mobility.      Follow Up Recommendations Outpatient PT;Follow surgeon's recommendation for DC plan and follow-up therapies    Equipment Recommendations  Rolling walker with 5" wheels    Recommendations for Other Services       Precautions / Restrictions Precautions Precautions: Fall Restrictions Weight Bearing Restrictions: No Other Position/Activity Restrictions: WBAT      Mobility  Bed Mobility Overal bed mobility: Needs Assistance Bed Mobility: Supine to Sit           General bed mobility comments: pt required use of bed rails and B UEs to scoot to EOB with supervision for safety    Transfers Overall transfer level: Needs assistance Equipment used: Rolling walker (2 wheeled) Transfers: Sit to/from Stand Sit  to Stand: Min guard         General transfer comment: pt required MIN guard for safety for sit to stand from EOB and BSC with cues for hand placement.  Ambulation/Gait Ambulation/Gait assistance: Min assist;Min guard Gait Distance (Feet): 70 Feet Assistive device: Rolling walker (2 wheeled) Gait Pattern/deviations: Step-to pattern;Decreased stride length;Decreased weight shift to right Gait velocity: decr   General Gait Details: Pt able to complete pre gait marching with therapist providing manual R knee block to promote extension, no knee buckling observed. pt required MIN assist to MIN guard for safety with cues for RW management and step to gait pattern with no LOB. Pt displayed slightly incr knee flexion in midstance on R LE with incr WB through UEs on RW.  Stairs Stairs: Yes Stairs assistance: Min assist Stair Management: No rails Number of Stairs: 3 General stair comments: pt was able to safely negotiate stairs with MIN assist for safety and cues for sequencing and "up with the good, down with the bad". Pt was able to verbalize proper guarding position for family members.  Wheelchair Mobility    Modified Rankin (Stroke Patients Only)       Balance Overall balance assessment: Needs assistance Sitting-balance support: No upper extremity supported;Feet supported Sitting balance-Leahy Scale: Good     Standing balance support: Bilateral upper extremity supported Standing balance-Leahy Scale: Poor Standing balance comment: pt required use of RW to maintain balance  Pertinent Vitals/Pain Pain Assessment: 0-10 Pain Score: 5  Pain Location: R knee Pain Descriptors / Indicators: Discomfort;Sore Pain Intervention(s): Limited activity within patient's tolerance;Monitored during session;Repositioned    Home Living Family/patient expects to be discharged to:: Private residence Living Arrangements: Alone Available Help at Discharge:  Family Type of Home: House Home Access: Stairs to enter Entrance Stairs-Rails: None Entrance Stairs-Number of Steps: 1 Home Layout: One level Home Equipment: Bedside commode;Shower seat Additional Comments: pt's 2 daughters will be staying with her while recovering    Prior Function Level of Independence: Independent               Hand Dominance   Dominant Hand: Right    Extremity/Trunk Assessment   Upper Extremity Assessment Upper Extremity Assessment: Overall WFL for tasks assessed    Lower Extremity Assessment Lower Extremity Assessment: RLE deficits/detail RLE Deficits / Details: Pt with difficulty performing full SLR  2/2 pain. RLE Sensation: WNL RLE Coordination: WNL    Cervical / Trunk Assessment Cervical / Trunk Assessment: Normal  Communication   Communication: No difficulties  Cognition Arousal/Alertness: Awake/alert Behavior During Therapy: WFL for tasks assessed/performed Overall Cognitive Status: Within Functional Limits for tasks assessed                                        General Comments      Exercises Total Joint Exercises Ankle Circles/Pumps: AROM;Both;20 reps;Seated Quad Sets: AROM;Right;5 reps;Seated Short Arc Quad: AROM;Right;5 reps;Seated Heel Slides: AROM;Right;5 reps;Seated Hip ABduction/ADduction: AROM;Right;5 reps;Seated Long Arc Quad: AROM;Right;5 reps;Seated   Assessment/Plan    PT Assessment Patient needs continued PT services  PT Problem List Decreased strength;Decreased range of motion;Decreased activity tolerance;Decreased balance;Decreased mobility;Decreased coordination;Pain;Decreased knowledge of use of DME       PT Treatment Interventions DME instruction;Gait training;Stair training;Functional mobility training;Therapeutic activities;Therapeutic exercise;Balance training;Patient/family education    PT Goals (Current goals can be found in the Care Plan section)  Acute Rehab PT Goals Patient  Stated Goal: none stated PT Goal Formulation: With patient Time For Goal Achievement: 08/11/20 Potential to Achieve Goals: Good    Frequency 7X/week   Barriers to discharge        Co-evaluation               AM-PAC PT "6 Clicks" Mobility  Outcome Measure Help needed turning from your back to your side while in a flat bed without using bedrails?: A Little Help needed moving from lying on your back to sitting on the side of a flat bed without using bedrails?: A Little Help needed moving to and from a bed to a chair (including a wheelchair)?: A Little Help needed standing up from a chair using your arms (e.g., wheelchair or bedside chair)?: A Little Help needed to walk in hospital room?: A Little Help needed climbing 3-5 steps with a railing? : A Little 6 Click Score: 18    End of Session Equipment Utilized During Treatment: Gait belt Activity Tolerance: Patient tolerated treatment well Patient left: in chair;with call bell/phone within reach;with nursing/sitter in room Nurse Communication: Mobility status PT Visit Diagnosis: Unsteadiness on feet (R26.81);Pain;Muscle weakness (generalized) (M62.81) Pain - Right/Left: Right Pain - part of body: Knee    Time: 7824-2353 PT Time Calculation (min) (ACUTE ONLY): 47 min   Charges:              Elna Breslow, SPT  Acute rehab  Marlise Fahr 08/04/2020, 7:11 PM

## 2020-08-04 NOTE — Op Note (Signed)
DATE OF SURGERY:  08/04/2020 TIME: 1:25 PM  PATIENT NAME:  Linda Frederick   AGE: 80 y.o.    PRE-OPERATIVE DIAGNOSIS:  Right knee primary localized valgus osteoarthritis  POST-OPERATIVE DIAGNOSIS:  Same  PROCEDURE: RIGHT total Knee Arthroplasty  SURGEON:  Johnny Bridge, MD   ASSISTANT: Rosario Adie, PA-C, present and scrubbed throughout the case, critical for assistance with exposure, retraction, instrumentation, and closure.   OPERATIVE IMPLANTS: DePuy attune size 5 narrow right femur, 4 tibia, with a size 5 mm polyethylene insert and a 35 mm patellar dome   PREOPERATIVE INDICATIONS:  MAITRI SCHNOEBELEN is a 80 y.o. year old female with end stage bone on bone degenerative arthritis of the knee who failed conservative treatment, including injections, antiinflammatories, activity modification, and assistive devices, and had significant impairment of their activities of daily living, and elected for Total Knee Arthroplasty.   The risks, benefits, and alternatives were discussed at length including but not limited to the risks of infection, bleeding, nerve injury, stiffness, blood clots, the need for revision surgery, cardiopulmonary complications, among others, and they were willing to proceed.  OPERATIVE FINDINGS AND UNIQUE ASPECTS OF THE CASE: The patellofemoral joint and medial compartment were in reasonably good condition but the lateral compartment had grade 4 eburnation on the tibia and the femur.  I cut the femur twice, my first cut was fairly conservative .  ESTIMATED BLOOD LOSS: 75 mL  OPERATIVE DESCRIPTION:  The patient was brought to the operative room and placed in a supine position.  Anesthesia was administered.  We started with spinal anesthetic but was unsuccessful and converted to general.  IV antibiotics were given.  The lower extremity was prepped and draped in the usual sterile fashion.  Time out was performed.  The leg was elevated and exsanguinated and the tourniquet was  inflated.  Anterior quadriceps tendon splitting approach was performed.  The patella was everted and osteophytes were removed.  The anterior horn of the medial and lateral meniscus was removed.  I measured the patella, which measured 23, and then measured 14 after the cut.  The distal femur was opened with the drill and the intramedullary distal femoral cutting jig was utilized, set at 5 degrees resecting 9 mm off the distal femur.  Care was taken to protect the collateral ligaments.  Then the extramedullary tibial cutting jig was utilized making the appropriate cut using the anterior tibial crest as a reference building in appropriate posterior slope.  Care was taken during the cut to protect the medial and collateral ligaments.  The proximal tibia was removed along with the posterior horns of the menisci.  The PCL was sacrificed.    The extensor gap was measured and was still a little bit tight, and then after removing 2 more off of the femur I had appropriate gap.  The distal femoral sizing jig was applied, taking care to avoid notching.  Then the 4-in-1 cutting jig was applied and the anterior and posterior femur was cut, along with the chamfer cuts.  All posterior osteophytes were removed.  The flexion gap was then measured and was symmetric with the extension gap.  I completed the distal femoral preparation using the appropriate jig to prepare the box.  The proximal tibia sized and prepared accordingly with the reamer and the punch, and then all components were trialed with the 74mm poly insert.  The knee was found to have excellent balance and full motion.    The above named components were  then cemented into place and all excess cement was removed.  The real polyethylene implant was placed.  After the cement had cured I released the tourniquet and confirmed excellent hemostasis with no major posterior vessel injury.    The knee was easily taken through a range of motion and the patella  tracked well and the knee irrigated copiously and the parapatellar and subcutaneous tissue closed with vicryl, and monocryl with steri strips for the skin.  The wounds were injected with marcaine, and dressed with sterile gauze and the patient was awakened and returned to the PACU in stable and satisfactory condition.  There were no complications.  Total tourniquet time was 75 minutes.

## 2020-08-05 ENCOUNTER — Encounter (HOSPITAL_COMMUNITY): Payer: Self-pay | Admitting: Orthopedic Surgery

## 2020-08-05 NOTE — Anesthesia Postprocedure Evaluation (Signed)
Anesthesia Post Note  Patient: Linda Frederick  Procedure(s) Performed: TOTAL KNEE ARTHROPLASTY (Right Knee)     Patient location during evaluation: PACU Anesthesia Type: General Level of consciousness: awake and alert Pain management: pain level controlled Vital Signs Assessment: post-procedure vital signs reviewed and stable Respiratory status: spontaneous breathing, nonlabored ventilation, respiratory function stable and patient connected to nasal cannula oxygen Cardiovascular status: blood pressure returned to baseline and stable Postop Assessment: no apparent nausea or vomiting Anesthetic complications: no   No complications documented.  Last Vitals:  Vitals:   08/04/20 1815 08/04/20 1915  BP: (!) 166/85 (!) 166/84  Pulse: 89 92  Resp: 16 16  Temp:  36.8 C  SpO2: 95% 95%    Last Pain:  Vitals:   08/04/20 1915  PainSc: [place]

## 2022-02-02 IMAGING — DX DG KNEE 1-2V PORT*R*
1 series · 1 of 1 positions shown · non-contrast
Comparison: None.

CLINICAL DATA: Status post right knee replacement

EXAM:
PORTABLE RIGHT KNEE - 2 VIEW

[knee lat]
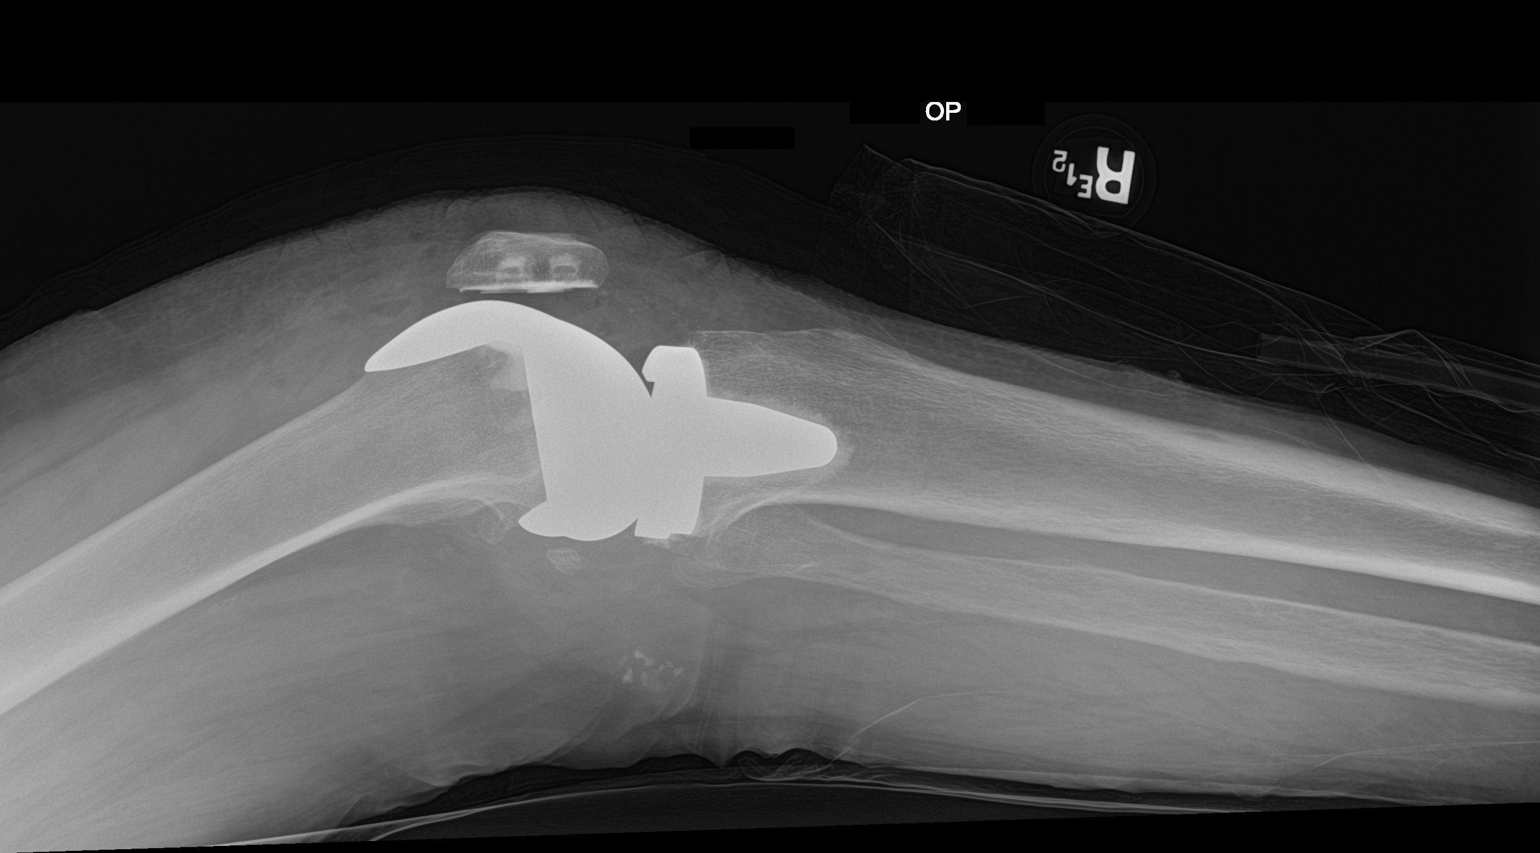

[1 of 1 positions shown; findings below may reference images not displayed]

FINDINGS: Right knee prosthesis is noted. No acute bony or soft tissue
abnormality is seen. Some calcifications are noted in the popliteal
fossa which may be vascular in origin. No other focal abnormality is
seen.
IMPRESSION: Status post right knee replacement.  No acute abnormality noted.

## 2022-05-12 ENCOUNTER — Ambulatory Visit
Admission: RE | Admit: 2022-05-12 | Discharge: 2022-05-12 | Disposition: A | Discharge status: Home / Self Care | Payer: Medicare PPO | Source: Ambulatory Visit | Attending: Internal Medicine | Admitting: Internal Medicine

## 2022-05-12 ENCOUNTER — Other Ambulatory Visit: Payer: Self-pay | Admitting: Internal Medicine

## 2022-05-12 DIAGNOSIS — R059 Cough, unspecified: Secondary | ICD-10-CM
